# Patient Record
Sex: Female | Born: 1980 | Race: White | Hispanic: No | Smoking: Never smoker
Health system: Southern US, Community
[De-identification: ages and names within clinical notes are randomized; demographics above are authoritative.]

## PROBLEM LIST (undated history)

## (undated) DIAGNOSIS — N8111 Cystocele, midline: Secondary | ICD-10-CM

## (undated) DIAGNOSIS — K649 Unspecified hemorrhoids: Secondary | ICD-10-CM

## (undated) DIAGNOSIS — E039 Hypothyroidism, unspecified: Secondary | ICD-10-CM

## (undated) DIAGNOSIS — N816 Rectocele: Secondary | ICD-10-CM

## (undated) DIAGNOSIS — D649 Anemia, unspecified: Secondary | ICD-10-CM

## (undated) DIAGNOSIS — N393 Stress incontinence (female) (male): Secondary | ICD-10-CM

## (undated) DIAGNOSIS — F32A Depression, unspecified: Secondary | ICD-10-CM

## (undated) DIAGNOSIS — F419 Anxiety disorder, unspecified: Secondary | ICD-10-CM

## (undated) HISTORY — DX: Cystocele, midline: N81.11

## (undated) HISTORY — DX: Stress incontinence (female) (male): N39.3

## (undated) HISTORY — PX: ANTERIOR AND POSTERIOR VAGINAL REPAIR: SUR5

## (undated) HISTORY — DX: Unspecified hemorrhoids: K64.9

## (undated) HISTORY — PX: HYSTEROSCOPY WITH D & C: SHX1775

## (undated) HISTORY — PX: BILATERAL SALPINGECTOMY: SHX5743

## (undated) HISTORY — DX: Hypothyroidism, unspecified: E03.9

## (undated) HISTORY — DX: Rectocele: N81.6

---
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
## 2009-07-19 ENCOUNTER — Inpatient Hospital Stay: Payer: Self-pay

## 2012-01-20 ENCOUNTER — Observation Stay: Payer: Self-pay | Admitting: Obstetrics and Gynecology

## 2012-05-25 ENCOUNTER — Inpatient Hospital Stay: Payer: Self-pay | Admitting: Obstetrics & Gynecology

## 2012-05-25 LAB — CBC WITH DIFFERENTIAL/PLATELET
Basophil %: 0.4 %
Eosinophil %: 0.4 %
HCT: 35.5 % (ref 35.0–47.0)
Lymphocyte #: 2.4 10*3/uL (ref 1.0–3.6)
MCH: 31.3 pg (ref 26.0–34.0)
MCHC: 34.3 g/dL (ref 32.0–36.0)
Monocyte %: 6.1 %
Platelet: 261 10*3/uL (ref 150–440)
RBC: 3.89 10*6/uL (ref 3.80–5.20)
RDW: 13.9 % (ref 11.5–14.5)
WBC: 14.2 10*3/uL — ABNORMAL HIGH (ref 3.6–11.0)

## 2012-05-27 LAB — HEMATOCRIT: HCT: 35 % (ref 35.0–47.0)
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
## 2015-02-03 NOTE — H&P (Signed)
L&D Evaluation:  History:   HPI 34yo G2P1001 at 1451w5d by first trimester U/S present with c/o rupture of membranes  at 2100 today.  [facility].  PNL: A+ / RI / RPR NR / Hep B - / HIV - / VI / GBS - / ASCUS HPV+ pap    Presents with leaking fluid    [name] Medical History anxiety/depression    [name] Surgical History none    Medications Pre Natal Vitamins  zoloft    Allergies NKDA    Social History none    Family History Non-Contributory   ROS:   ROS All systems were reviewed.  HEENT, CNS, GI, GU, Respiratory, CV, Renal and Musculoskeletal systems were found to be normal.   Exam:   Vital Signs stable    General no apparent distress    Mental Status clear    Chest clear    Heart normal sinus rhythm    Abdomen gravid, non-tender    Estimated Fetal Weight Small for gestational age, 11%ile by U/S on 8/27    Pelvic 2 / 50 / -3    Mebranes Ruptured, gross    Description clear    FHT Description 140 moderate variability, no accels, no decels    Ucx absent   Impression:   Impression 34yo G2P1 at 4751w5d with PROM   Plan:   Plan Will await onset of labor for ~4hrs p rupture of membranes, if no signs of labor will augment with pitocin   Electronic Signatures: Garnette GunnerStansbury Clipp, Ali LoweEryn K (MD)  (Signed 30-Aug-13 22:37)  Authored: L&D Evaluation   Last Updated: 30-Aug-13 22:37 by Garnette GunnerStansbury Clipp, Ali LoweEryn K (MD)

## 2016-07-20 ENCOUNTER — Encounter: Payer: Self-pay | Admitting: *Deleted

## 2016-08-24 ENCOUNTER — Encounter: Payer: Self-pay | Admitting: Obstetrics and Gynecology

## 2016-09-05 ENCOUNTER — Ambulatory Visit (INDEPENDENT_AMBULATORY_CARE_PROVIDER_SITE_OTHER): Payer: 59 | Admitting: Obstetrics and Gynecology

## 2016-09-05 ENCOUNTER — Encounter: Payer: Self-pay | Admitting: Obstetrics and Gynecology

## 2016-09-05 DIAGNOSIS — Z01419 Encounter for gynecological examination (general) (routine) without abnormal findings: Secondary | ICD-10-CM

## 2016-09-05 DIAGNOSIS — N393 Stress incontinence (female) (male): Secondary | ICD-10-CM

## 2016-09-05 DIAGNOSIS — N920 Excessive and frequent menstruation with regular cycle: Secondary | ICD-10-CM

## 2016-09-05 DIAGNOSIS — Z0000 Encounter for general adult medical examination without abnormal findings: Secondary | ICD-10-CM

## 2016-09-05 DIAGNOSIS — N923 Ovulation bleeding: Secondary | ICD-10-CM

## 2016-09-05 HISTORY — DX: Stress incontinence (female) (male): N39.3

## 2016-09-05 LAB — POCT URINALYSIS DIPSTICK
Bilirubin, UA: NEGATIVE
GLUCOSE UA: NEGATIVE
KETONES UA: NEGATIVE
Nitrite, UA: NEGATIVE
Protein, UA: NEGATIVE
RBC UA: NEGATIVE
Urobilinogen, UA: 0.2
pH, UA: 7

## 2016-09-05 NOTE — Progress Notes (Signed)
Obstetrics and Gynecology Visit Established Patient Evaluation  Appointment Date: 09/05/2016  [facility]  Primary Care Provider: No primary care provider on file.  Chief Complaint:  Chief Complaint  Patient presents with  . Gynecologic Exam    History of Present Illness: Lindsay Underwood is a 35 y.o. Caucasian G2P2002 ([name] last menstrual period was 08/22/2016.), seen for the above chief complaint.   Patient still with intermenstrual bleeding. She does have regular, qmonth periods that last for about 3-4days and feels heavier and crampier than usual with 2-3d of intermenstrual bleeding (slightly crampy and not has heavy as a period) about half way through her period.   Patient also has occasional SUI s/s but doesn't have to wear pad or panty liner  Patient feels occasional bulge s/s and does have to splint to defecate fully. She also feels sometimes she doesn't empty her bladder fully (voids and then feels like she has to void again soon thereafter). No increased urinary frequency or malodorous urine.   No breast s/s, fevers, chills, chest pain, SOB, nausea, vomiting, abdominal pain, dysuria, hematuria, vaginal itching, dyspareunia, OAB s/s, diarrhea, constipation, blood in BMs  Review of Systems: as noted in the History of Present Illness.   Past Medical History:  Past Medical History:  Diagnosis Date  . Cystocele, midline   . Rectocele     Past Surgical History:  History reviewed. No pertinent surgical history.  Past Obstetrical History:  OB History  Gravida Para Term Preterm AB Living  2 2 2     2   SAB TAB Ectopic Multiple Live Births          2    # Outcome Date GA Lbr Len/2nd Weight Sex Delivery Anes PTL Lv  2 Term 05/26/12 6060w0d  6 lb 5 oz (2.863 kg) M Vag-Spont   LIV  1 Term 07/19/09 6625w0d  6 lb 1 oz (2.75 kg) M Vag-Spont   LIV      Past Gynecological History: As per HPI. No. history of abnormal pap smears (last pap smear: 2016,  which was negative and hpv negative) No. history of STIs.   She is currently using condoms for contraception.   Social History:  Social History   Social History  . Marital status: Married    Spouse name: N/A  . Number of children: N/A  . Years of education: N/A   Occupational History  . Not on file.   Social History Main Topics  . Smoking status: Never Smoker  . Smokeless tobacco: Never Used  . Alcohol use Yes     Comment: occasional  . Drug use: No  . Sexual activity: Yes    Birth control/ protection: None   Other Topics Concern  . Not on file   Social History Narrative  . No narrative on file    Family History:  Family History  Problem Relation Age of Onset  . Diabetes Father   . Heart disease Father   . Huntington's disease Mother   . Huntington's disease Brother    She denies any female cancers, bleeding or blood clotting disorders.  Patient is negative for Huntington's  Medications Ms. Birdena JubileeMuniz had no medications administered during this visit. Current Outpatient Prescriptions  Medication Sig Dispense Refill  . acetaminophen (TYLENOL) 500 MG chewable tablet Chew 500 mg by mouth every 6 (six) hours as needed for pain.    . cetirizine (ZYRTEC) 10 MG tablet Take 10 mg by mouth daily.  No current facility-administered medications for this visit.     Allergies Patient has no known allergies.   Physical Exam:  BP 105/70 (BP Location: Left Arm, Patient Position: Sitting, Cuff Size: Normal)   Pulse 72   Resp 18   Ht 5\' 4"  (1.626 m)   Wt 131 lb (59.4 kg)   LMP 08/22/2016   BMI 22.49 kg/m  Body mass index is 22.49 kg/m. General appearance: Well nourished, well developed female in no acute distress.  Neck:  Supple, normal appearance, and no thyromegaly  Cardiovascular: normal s1 and s2.  No murmurs, rubs or gallops. Respiratory:  Clear to auscultation bilateral. Normal respiratory effort Abdomen: positive bowel sounds and no masses, hernias; diffusely  non tender to palpation, non distended Breasts: breasts appear normal, no suspicious masses, no skin or nipple changes or axillary nodes, and negative inspection. Neuro/Psych:  Normal mood and affect.  Skin:  Warm and dry.  Lymphatic:  No inguinal lymphadenopathy.   Pelvic exam: is not limited by body habitus EGBUS: within normal limits, Vagina: within normal limits and with no blood or discharge in the vault, Cervix: normal appearing cervix without tenderness, discharge or lesions. Uterus:  Nonenlarged, nttp, mobile and Adnexa:  normal adnexa Rectovaginal: deferred. Several small approx 1cm sized hemorrhoids  POP-Q Aa 0 Ba -1 C 6 D 6 GH 3 PB 3 TVL 8 Ap 0 Bp -1  Mild strength with contraction  Laboratory: as above. U/a with moderate leukocytes  Radiology: 11/2014 u/s at [facility] 9 x 5 x 5 cm uterus, anteverted, normal ES and ovaries.  Assessment: pt stable  Plan:  *Anterior and posterior vault prolapse: I told her that based on degree of prolapse with possible apical prolapse that if patient is amenable to surgical intervention that I'd have her evaluated and operated on by Urogyn. I told her that based on her s/s that exp management is certaintly an option but I told her that if she desired intervention that Urogyn would like recommend surgery and possible hysterectomy given some apical prolapse. She is sure that she's done with childbearing. I told her that if she thinks she'd want a uterine sparing procedure that they can also evaluate her with h/s and do BTL then too. Pt to consider options and let us know if she'd like a referral to them to go over her options.  *AUB: [facility] surg path results for embx done last year with me but patient states it was negative and only showed endometrial polyp. I told her if that's the case then would recommend h/s, d&c for polyp and pt to consider this. See above. *Well woman exam: pt considering BC options. If for  surgeries, she'd like to have BTL. R/b/a of l/s BTL d/w pt.   Orders Placed This Encounter  Procedures  . Urine Culture  . POCT Urinalysis Dipstick    RTC PRN  Cornelia Copaharlie Anastassia Noack, Jr MD Attending [facility](Faculty Practice)

## 2016-09-05 NOTE — Patient Instructions (Signed)
Laparoscopic Tubal Ligation Introduction Laparoscopic tubal ligation is a procedure to close the fallopian tubes. This is done so that you cannot get pregnant. When the fallopian tubes are closed, the eggs that your ovaries release cannot enter the uterus, and sperm cannot reach the released eggs. A laparoscopic tubal ligation is sometimes called "getting your tubes tied." You should not have this procedure if you want to get pregnant someday or if you are unsure about having more children. Tell a health care provider about:  Any allergies you have.  All medicines you are taking, including vitamins, herbs, eye drops, creams, and over-the-counter medicines.  Any problems you or family members have had with anesthetic medicines.  Any blood disorders you have.  Any surgeries you have had.  Any medical conditions you have.  Whether you are pregnant or may be pregnant.  Any past pregnancies. What are the risks? Generally, this is a safe procedure. However, problems may occur, including:  Infection.  Bleeding.  Injury to surrounding organs.  Side effects from anesthetics.  Failure of the procedure. This procedure can increase your risk of a kind of pregnancy in which a fertilized egg attaches to the outside of the uterus (ectopic pregnancy). What happens before the procedure?  Ask your health care provider about:  Changing or stopping your regular medicines. This is especially important if you are taking diabetes medicines or blood thinners.  Taking medicines such as aspirin and ibuprofen. These medicines can thin your blood. Do not take these medicines before your procedure if your health care provider instructs you not to.  Follow instructions from your health care provider about eating and drinking restrictions.  Plan to have someone take you home after the procedure.  If you go home right after the procedure, plan to have someone with you for 24 hours. What happens during  the procedure?  You will be given one or more of the following:  A medicine to help you relax (sedative).  A medicine to numb the area (local anesthetic).  A medicine to make you fall asleep (general anesthetic).  A medicine that is injected into an area of your body to numb everything below the injection site (regional anesthetic).  An IV tube will be inserted into one of your veins. It will be used to give you medicines and fluids during the procedure.  Your bladder may be emptied with a small tube (catheter).  If you have been given a general anesthetic, a tube will be put down your throat to help you breathe.  Two small cuts (incisions) will be made in your lower abdomen and near your belly button.  Your abdomen will be inflated with a gas. This will let the surgeon see better and will give the surgeon room to work.  A thin, lighted tube (laparoscope) with a camera attached will be inserted into your abdomen through one of the incisions. Small instruments will be inserted through the other incision.  The fallopian tubes will be tied off, burned (cauterized), or blocked with a clip, ring, or clamp. A small portion in the center of each fallopian tube may be removed.  The gas will be released from the abdomen.  The incisions will be closed with stitches (sutures).  A bandage (dressing) will be placed over the incisions. The procedure may vary among health care providers and hospitals. What happens after the procedure?  Your blood pressure, heart rate, breathing rate, and blood oxygen level will be monitored often until the medicines you  were given have worn off.  You will be given medicine to help with pain, nausea, and vomiting as needed. This information is not intended to replace advice given to you by your health care provider. Make sure you discuss any questions you have with your health care provider. Document Released: 12/19/2000 Document Revised: 02/18/2016 Document  Reviewed: 08/23/2015  2017 Elsevier Anterior and Posterior Colporrhaphy Anterior or posterior colporrhaphy is surgery to fix a prolapse of organs in the genital tract. Prolapse means the falling down, bulging, dropping, or drooping of an organ. Organs that commonly prolapse include the rectum, bladder, vagina, and uterus. Prolapse can affect a single organ or several organs at the same time. This often worsens when women stop having their monthly periods (menopause) because estrogen loss weakens the muscles and tissues in the genital tract. In addition, prolapse happens when the organs are damaged or weakened. This commonly happens after childbirth and as a result of aging. Surgery is often done for severe prolapses.  The type of colporrhaphy done depends on the type of genital prolapse. Types of genital prolapse include the following:   Cystocele. This is a prolapse of the upper (anterior) wall of the vagina. The anterior wall bulges into the vagina and brings the bladder with it.   Rectocele. This is a prolapse of the lower (posterior) wall of the vagina. The posterior vaginal wall bulges into the vagina and brings the rectum with it.   Enterocele. This is a prolapse of part of the pelvic organs called the pouch of Douglas. It also involves a portion of the small bowel. It appears as a bulge under the neck of the uterus at the top of the back wall of the vagina.   Procidentia. This is a complete prolapse of the uterus and the cervix. The prolapse can be seen and felt coming out of the vagina. LET [facility] KNOW ABOUT:   Any allergies you have.   All medicines you are taking, including vitamins, herbs, eye drops, creams, and over-the-counter medicines.   Previous problems you or members of your family have had with the use of anesthetics.   Any blood disorders you have.   Previous surgeries you have had.   Medical conditions you have.   Smoking history or history  of alcohol use.   Possibility of pregnancy, if this applies.  RISKS AND COMPLICATIONS Generally, anterior or posterior colporrhaphy is a safe procedure. However, as with any procedure, complications can occur. Possible complications include:   Infection.   Damage to other organs during surgery.   Bleeding after surgery.   Problems urinating.   Problems from the anesthetic.  BEFORE THE PROCEDURE  Ask your health care provider about changing or stopping your regular medicines.   Do not eat or drink anything for at least 8 hours before the surgery.   If you smoke, do not smoke for at least 2 weeks before the surgery.   Make plans to have someone drive you home after your hospital stay. Also, arrange for someone to help you with activities during recovery. PROCEDURE  You may be given medicine to help you relax before the surgery (sedative). During the surgery you will be given medicine to make you sleep through the procedure (general anesthetic) or medicine to numb you from the waist down (spinal anesthetic). This medicine will be given through an intravenous (IV) access tube that is put into one of your veins.  The procedure will vary depending on the type  of repair:   Anterior repair. A cut (incision) is made in the midline section of the front part of the vaginal wall. A triangular-shaped piece of vaginal tissue is removed, and the stronger, healthier tissue is sewn together in order to support and suspend the bladder.   Posterior repair. An incision is made midline on the back wall of the vagina. A triangular portion of vaginal skin is removed to expose the muscle. Excess tissue is removed, and stronger, healthier muscle and ligament tissue is sewn together to support the rectum.   Anterior and posterior repair. Both procedures are done during the same surgery. AFTER THE PROCEDURE You will be taken to a recovery area. Your blood pressure, pulse, breathing, and  temperature (vital signs) will be monitored. This is done until you are stable. Then you will be transferred to a hospital room.  After surgery, you will have a small rubber tube in place to drain your bladder (urinary catheter). This will be in place for 2 to 7 days or until your bladder is working properly on its own. The IV access tube will be removed in 1 to 3 days. You may have a gauze packing in your vagina to prevent bleeding. This will be removed 2 or 3 days after the surgery. You will likely need to stay in the hospital for 3 to 5 days.  This information is not intended to replace advice given to you by your health care provider. Make sure you discuss any questions you have with your health care provider. Document Released: 12/03/2003 Document Revised: 05/15/2013 Document Reviewed: 02/01/2013 Elsevier Interactive Patient Education  2017 Elsevier Inc. Hysteroscopy Hysteroscopy is a procedure used for looking inside the womb (uterus). It may be done for various reasons, including:  To evaluate abnormal bleeding, fibroid (benign, noncancerous) tumors, polyps, scar tissue (adhesions), and possibly cancer of the uterus.  To look for lumps (tumors) and other uterine growths.  To look for causes of why a woman cannot get pregnant (infertility), causes of recurrent loss of pregnancy (miscarriages), or a lost intrauterine device (IUD).  To perform a sterilization by blocking the fallopian tubes from inside the uterus. In this procedure, a thin, flexible tube with a tiny light and camera on the end of it (hysteroscope) is used to look inside the uterus. A hysteroscopy should be done right after a menstrual period to be sure you are not pregnant. LET [facility] KNOW ABOUT:   Any allergies you have.  All medicines you are taking, including vitamins, herbs, eye drops, creams, and over-the-counter medicines.  Previous problems you or members of your family have had with the use of  anesthetics.  Any blood disorders you have.  Previous surgeries you have had.  Medical conditions you have. RISKS AND COMPLICATIONS  Generally, this is a safe procedure. However, as with any procedure, complications can occur. Possible complications include:  Putting a hole in the uterus.  Excessive bleeding.  Infection.  Damage to the cervix.  Injury to other organs.  Allergic reaction to medicines.  Too much fluid used in the uterus for the procedure. BEFORE THE PROCEDURE   Ask your health care provider about changing or stopping any regular medicines.  Do not take aspirin or blood thinners for 1 week before the procedure, or as directed by your health care provider. These can cause bleeding.  If you smoke, do not smoke for 2 weeks before the procedure.  In some cases, a medicine is placed in the  cervix the day before the procedure. This medicine makes the cervix have a larger opening (dilate). This makes it easier for the instrument to be inserted into the uterus during the procedure.  Do not eat or drink anything for at least 8 hours before the surgery.  Arrange for someone to take you home after the procedure. PROCEDURE   You may be given a medicine to relax you (sedative). You may also be given one of the following:  A medicine that numbs the area around the cervix (local anesthetic).  A medicine that makes you sleep through the procedure (general anesthetic).  The hysteroscope is inserted through the vagina into the uterus. The camera on the hysteroscope sends a picture to a TV screen. This gives the surgeon a good view inside the uterus.  During the procedure, air or a liquid is put into the uterus, which allows the surgeon to see better.  Sometimes, tissue is gently scraped from inside the uterus. These tissue samples are sent to a lab for testing. AFTER THE PROCEDURE   If you had a general anesthetic, you may be groggy for a couple hours after the  procedure.  If you had a local anesthetic, you will be able to go home as soon as you are stable and feel ready.  You may have some cramping. This normally lasts for a couple days.  You may have bleeding, which varies from light spotting for a few days to menstrual-like bleeding for 3-7 days. This is normal.  If your test results are not back during the visit, make an appointment with your health care provider to find out the results. This information is not intended to replace advice given to you by your health care provider. Make sure you discuss any questions you have with your health care provider. Document Released: 12/19/2000 Document Revised: 07/03/2013 Document Reviewed: 04/11/2013 Elsevier Interactive Patient Education  2017 [employer].

## 2016-09-06 ENCOUNTER — Encounter: Payer: Self-pay | Admitting: *Deleted

## 2016-09-06 LAB — URINE CULTURE: ORGANISM ID, BACTERIA: NO GROWTH

## 2016-09-26 DIAGNOSIS — D649 Anemia, unspecified: Secondary | ICD-10-CM

## 2016-09-26 HISTORY — DX: Anemia, unspecified: D64.9

## 2016-10-05 ENCOUNTER — Other Ambulatory Visit: Payer: Self-pay | Admitting: Obstetrics and Gynecology

## 2016-10-05 DIAGNOSIS — N812 Incomplete uterovaginal prolapse: Secondary | ICD-10-CM

## 2016-10-19 ENCOUNTER — Ambulatory Visit
Admission: EM | Admit: 2016-10-19 | Discharge: 2016-10-19 | Disposition: A | Discharge status: Home / Self Care | Payer: 59 | Attending: Family Medicine | Admitting: Family Medicine

## 2016-10-19 DIAGNOSIS — J0100 Acute maxillary sinusitis, unspecified: Secondary | ICD-10-CM | POA: Diagnosis not present

## 2016-10-19 LAB — RAPID INFLUENZA A&B ANTIGENS
Influenza A (ARMC): NEGATIVE
Influenza B (ARMC): NEGATIVE

## 2016-10-19 MED ORDER — AMOXICILLIN-POT CLAVULANATE 875-125 MG PO TABS: 1.0000 | ORAL_TABLET | Freq: Two times a day (BID) | ORAL | 0 refills | Status: DC

## 2016-10-19 NOTE — ED Triage Notes (Signed)
Patient complains of sinus pain and pressure, drainage, headaches and sneezing. Patient states that symptoms have been constant x 1 month with worsening yesterday. Patient states that she has been taking OTC allergy medication without relief.

## 2016-10-19 NOTE — Discharge Instructions (Signed)
Take medication as prescribed. Rest. Drink plenty of fluids.  ° °Follow up with your primary care physician this week as needed. Return to Urgent care for new or worsening concerns.  ° °

## 2016-10-19 NOTE — ED Provider Notes (Signed)
[facility] ____________________________________________  Time seen: Approximately 1620 PM  I have reviewed the triage vital signs and the nursing notes.   HISTORY  Chief Complaint Sinusitis  HPI Lindsay Underwood is a 36 y.o. female presenting for the complaints of runny nose, nasal congestion, sinus pressure and sinus discomfort the last 3-4 weeks. Patient reports history of seasonal allergies as well as intermittent sinusitis. Patient reports initially she felt like she had a cold with sinus irritation initially, however reports symptoms started to improve but then worsened. Patient reports the last week she has had continuous sinus pressure discomfort around her cheek bones with production of very thick  yellowish mucus with blowing her nose. Patient reports today she felt like she had some chills and had a low-grade fever. Reports has been taking over-the-counter Tylenol or ibuprofen as well as Sudafed with some improvement, but no resolution. Reports continues to eat and drink well. Denies recent sickness. States occasional cough. Patient reports has continued to remain active, and otherwise feels well.  Denies chest pain, shortness of breath, abdominal pain, dysuria, extremity pain, extremity swelling or rash. Denies recent sickness. Denies recent antibiotic use.   [name] last menstrual period was 09/26/2016. Denies pregnancy   Past Medical History:  Diagnosis Date  . Cystocele, midline   . Hemorrhoids   . Rectocele     Patient Active Problem List   Diagnosis Date Noted  . Urinary, incontinence, stress female 09/05/2016  . Intermenstrual bleeding 09/05/2016    Past Surgical History:  Procedure Laterality Date  . NO PAST SURGERIES       No current facility-administered medications for this encounter.   Current Outpatient Prescriptions:  .  acetaminophen (TYLENOL) 500 MG chewable tablet, Chew 500 mg by mouth every 6 (six) hours as needed for pain., Disp: ,  Rfl:  .  cetirizine (ZYRTEC) 10 MG tablet, Take 10 mg by mouth daily., Disp: , Rfl:  .  amoxicillin-clavulanate (AUGMENTIN) 875-125 MG tablet, Take 1 tablet by mouth every 12 (twelve) hours., Disp: 20 tablet, Rfl: 0  Allergies Patient has no known allergies.  Family History  Problem Relation Age of Onset  . Diabetes Father   . Heart disease Father   . Huntington's disease Mother   . Huntington's disease Brother     Social History Social History  Substance Use Topics  . Smoking status: Never Smoker  . Smokeless tobacco: Never Used  . Alcohol use Yes     Comment: occasional    Review of Systems Constitutional: As above.  Eyes: No visual changes. ENT: No sore throat. Cardiovascular: Denies chest pain. Respiratory: Denies shortness of breath. Gastrointestinal: No abdominal pain.  No nausea, no vomiting.  No diarrhea.  No constipation. Genitourinary: Negative for dysuria. Musculoskeletal: Negative for back pain. Skin: Negative for rash. Neurological: Negative for headaches, focal weakness or numbness.  10-point ROS otherwise negative.  ____________________________________________   PHYSICAL EXAM:  VITAL SIGNS: ED Triage Vitals  Enc Vitals Group     BP 10/19/16 1550 (!) 105/58     Pulse Rate 10/19/16 1550 78     Resp 10/19/16 1550 17     Temp 10/19/16 1550 98.9 F (37.2 C)     Temp Source 10/19/16 1550 Oral     SpO2 10/19/16 1550 100 %     Weight 10/19/16 1548 130 lb (59 kg)     Height 10/19/16 1548 5\' 4"  (1.626 m)     Head Circumference --      Peak Flow --  Pain Score 10/19/16 1549 6     Pain Loc --      Pain Edu? --      Excl. in GC? --     Constitutional: Alert and oriented. Well appearing and in no acute distress. Eyes: Conjunctivae are normal. PERRL. EOMI. Head: Atraumatic.Mild to moderate tenderness to palpation bilateral maxillary sinuses; no frontal sinus tenderness to palpation. No swelling. No erythema.   Ears: no erythema, normal TMs  bilaterally.   Nose: nasal congestion with bilateral nasal turbinate erythema and edema.   Mouth/Throat: Mucous membranes are moist.  Oropharynx non-erythematous.No tonsillar swelling or exudate.  Neck: No stridor.  No cervical spine tenderness to palpation. Hematological/Lymphatic/Immunilogical: No cervical lymphadenopathy. Cardiovascular: Normal rate, regular rhythm. Grossly normal heart sounds.  Good peripheral circulation. Respiratory: Normal respiratory effort.  No retractions. No wheezes, rales or rhonchi. Good air movement.  Gastrointestinal: Soft and nontender. No distention.  No CVA tenderness. Musculoskeletal: Ambulatory with steady gait. No cervical, thoracic or lumbar tenderness to palpation.  Neurologic:  Normal speech and language. No gait instability. Skin:  Skin is warm, dry and intact. No rash noted. Psychiatric: Mood and affect are normal. Speech and behavior are normal.  ___________________________________________   LABS (all labs ordered are listed, but only abnormal results are displayed)  Labs Reviewed  RAPID INFLUENZA A&B ANTIGENS ([facility] ONLY)   PROCEDURES Procedures    INITIAL IMPRESSION / ASSESSMENT AND PLAN / ED COURSE  Pertinent labs & imaging results that were available during my care of the patient were reviewed by me and considered in my medical decision making (see chart for details).  Well-appearing patient. No acute distress. Suspect sinusitis. Patient however stresses concern of influenza as well. Influenza test negative. Will treat patient for sinusitis with oral Augmentin. Encouraged supportive care, rest, fluids, Tylenol or ibuprofen as needed. Discussed follow-up and return parameters.Discussed indication, risks and benefits of medications with patient.  Discussed follow up with Primary care physician this week. Discussed follow up and return parameters including no resolution or any worsening concerns. Patient verbalized understanding and agreed  to plan.   ____________________________________________   FINAL CLINICAL IMPRESSION(S) / ED DIAGNOSES  Final diagnoses:  Acute maxillary sinusitis, recurrence not specified     Discharge Medication List as of 10/19/2016  5:05 PM    START taking these medications   Details  amoxicillin-clavulanate (AUGMENTIN) 875-125 MG tablet Take 1 tablet by mouth every 12 (twelve) hours., Starting Wed 10/19/2016, Normal        Note: This dictation was prepared with Dragon dictation along with smaller phrase technology. Any transcriptional errors that result from this process are unintentional.         Renford DillsLindsey Prisila Dlouhy, NP 10/19/16 2101

## 2018-03-14 ENCOUNTER — Encounter: Payer: Self-pay | Admitting: Obstetrics and Gynecology

## 2018-03-14 ENCOUNTER — Ambulatory Visit (INDEPENDENT_AMBULATORY_CARE_PROVIDER_SITE_OTHER): Payer: BLUE CROSS/BLUE SHIELD | Admitting: Obstetrics and Gynecology

## 2018-03-14 VITALS — BP 109/70 | HR 69

## 2018-03-14 DIAGNOSIS — Z01419 Encounter for gynecological examination (general) (routine) without abnormal findings: Secondary | ICD-10-CM

## 2018-03-14 DIAGNOSIS — N923 Ovulation bleeding: Secondary | ICD-10-CM

## 2018-03-14 DIAGNOSIS — Z124 Encounter for screening for malignant neoplasm of cervix: Secondary | ICD-10-CM

## 2018-03-14 DIAGNOSIS — Z113 Encounter for screening for infections with a predominantly sexual mode of transmission: Secondary | ICD-10-CM

## 2018-03-14 DIAGNOSIS — Z1151 Encounter for screening for human papillomavirus (HPV): Secondary | ICD-10-CM | POA: Diagnosis not present

## 2018-03-14 NOTE — Progress Notes (Signed)
Discuss cycle  

## 2018-03-14 NOTE — Progress Notes (Signed)
Obstetrics and Gynecology Annual Patient Evaluation  Appointment Date: 03/14/2018  OBGYN Clinic: [facility]  Primary Care Provider: Nira Retort Chief Complaint:  Chief Complaint  Patient presents with  . Gynecologic Exam    History of Present Illness: Samatha Anspach is a 37 y.o. Caucasian G2P2002 ([name] last menstrual period was 02/17/2018.), seen for the above chief complaint. Her past medical history is significant for SVD x 2, 07/2017 l/s BS/hysteroscopy/D&C/A-P repair at [facility]; pathology negative  Patient states that AUB was fixed for a month or two but has been persistent since then. She states she has a period for about 5 days and heavy and painful especially the first few days but then has intermittent bleeding and spotting for sometimes a week or two after that. It can feel somewhat crampy and be BRB or old brown in color.   Has rare dyspareunia with insertion.    No breast s/s, fevers, nausea, vomiting, abdominal pain, dysuria, hematuria, vaginal itching, SUI or OAB  Review of Systems:as noted in the History of Present Illness.   Past Medical History:  Past Medical History:  Diagnosis Date  . Cystocele, midline   . Hemorrhoids   . Rectocele   . Urinary, incontinence, stress female 09/05/2016    Past Surgical History:  Past Surgical History:  Procedure Laterality Date  . ANTERIOR AND POSTERIOR VAGINAL REPAIR     07/2017. done with h/s, D&C, BTL  . BILATERAL SALPINGECTOMY     laparoscopic  . HYSTEROSCOPY W/D&C      Past Obstetrical History:  OB History  Gravida Para Term Preterm AB Living  2 2 2     2   SAB TAB Ectopic Multiple Live Births          2    # Outcome Date GA Lbr Len/2nd Weight Sex Delivery Anes PTL Lv  2 Term 05/26/12 [redacted]w[redacted]d  6 lb 5 oz (2.863 kg) M Vag-Spont   LIV  1 Term 07/19/09 [redacted]w[redacted]d  6 lb 1 oz (2.75 kg) M Vag-Spont   LIV    Past Gynecological History: As per HPI. History of Pap Smear(s): Yes.   Last pap  2016, which was nilm/hpv neg She is currently using b/l salpingectomy for contraception.   Social History:  Social History   Socioeconomic History  . Marital status: Married    Spouse name: Not on file  . Number of children: Not on file  . Years of education: Not on file  . Highest education level: Not on file  Occupational History  . Not on file  Social Needs  . Financial resource strain: Not on file  . Food insecurity:    Worry: Not on file    Inability: Not on file  . Transportation needs:    Medical: Not on file    Non-medical: Not on file  Tobacco Use  . Smoking status: Never Smoker  . Smokeless tobacco: Never Used  Substance and Sexual Activity  . Alcohol use: Yes    Comment: occasional  . Drug use: No  . Sexual activity: Yes    Birth control/protection: None  Lifestyle  . Physical activity:    Days per week: Not on file    Minutes per session: Not on file  . Stress: Not on file  Relationships  . Social connections:    Talks on phone: Not on file    Gets together: Not on file    Attends religious service: Not on file    Active member of  club or organization: Not on file    Attends meetings of clubs or organizations: Not on file    Relationship status: Not on file  . Intimate partner violence:    Fear of current or ex partner: Not on file    Emotionally abused: Not on file    Physically abused: Not on file    Forced sexual activity: Not on file  Other Topics Concern  . Not on file  Social History Narrative  . Not on file    Family History:  Family History  Problem Relation Age of Onset  . Diabetes Father   . Heart disease Father   . Huntington's disease Mother   . Huntington's disease Brother    She denies any female cancers  Medications Agustin CreeHolly Mena had no medications administered during this visit. Current Outpatient Medications  Medication Sig Dispense Refill  . acetaminophen (TYLENOL) 500 MG chewable tablet Chew 500 mg by mouth every 6  (six) hours as needed for pain.    . cetirizine (ZYRTEC) 10 MG tablet Take 10 mg by mouth daily.    . Multiple Vitamin (MULTI-VITAMINS) TABS Take by mouth.     No current facility-administered medications for this visit.     Allergies Patient has no known allergies.   Physical Exam:  BP 109/70   Pulse 69   LMP 02/17/2018  There is no height or weight on file to calculate BMI.  General appearance: Well nourished, well developed female in no acute distress.  Neck:  Supple, normal appearance, and no thyromegaly  Cardiovascular: normal s1 and s2.  No murmurs, rubs or gallops. Respiratory:  Clear to auscultation bilateral. Normal respiratory effort Abdomen: positive bowel sounds and no masses, hernias; diffusely non tender to palpation, non distended Breasts: breasts appear normal, no suspicious masses, no skin or nipple changes or axillary nodes, and normal palpation. Neuro/Psych:  Normal mood and affect.  Skin:  Warm and dry.  Lymphatic:  No inguinal lymphadenopathy.   Pelvic exam: is not limited by body habitus EGBUS: within normal limits, Vagina: within normal limits and with no blood or discharge in the vault, Cervix: normal appearing cervix without tenderness, discharge or lesions. Uterus:  nonenlarged and non tender and Adnexa:  normal adnexa and no mass, fullness, tenderness  No pain during exam Anterior and posterior vault comes to about -1 to -2 on valsalva with slight cervix movement with valsalva. No e/o prolapse w/o valsalva Rectovaginal: deferred. 1.5-2cm deflated hemorrhoids seen  Laboratory: no  Radiology:  EXAM: US PELVIC PROTOCOL TRANSABDOMINAL AND TRANSVAGINAL COMPLETE  INDICATION: 37 years old Female with abnormal vaginal bleeding.   COMPARISON:None  TECHNIQUE:The examination was performed in two phases.First, transabdominal technique was performed utilizing the bladder as a sonic window.Second, in order to optimally evaluate the adnexal regions  and endometrium, an endovaginal study was performed.  FINDINGS: Uterus:The uterus measures 7.3 x 4.4 x 5.6 cm. No focal myometrial masses are seen.Endometrial stripe thickness measures 5 mm. No endometrial masses identified.  Right ovary: The right ovary measures 2.6 x 1.9 x 1.8 cm. No suspicious right ovarian masses are seen. There are small follicles.There is normal ovarian blood flow.  Left ovary:The left ovary measures 3.4 x 1.9 x 1.7 cm. No suspicious left ovarian masses are seen. There are small follicles.There is normal ovarian blood flow.  Negative for other mass or free pelvic fluid.  IMPRESSION:  Unremarkable pelvic ultrasound.  Electronically Signed DG:LOVFby:Mark Dorothyann GibbsNeely, MD, [facility] Electronically Signed on:08/15/2017 8:01 PM  Other Result  Information  Interface, Rad Results In - 08/15/2017  8:02 PM EST EXAM: US PELVIC PROTOCOL TRANSABDOMINAL AND TRANSVAGINAL COMPLETE  INDICATION: 37 years old Female with abnormal vaginal bleeding.   COMPARISON:  None  TECHNIQUE:  The examination was performed in two phases.  First, transabdominal technique was performed utilizing the bladder as a sonic window.  Second, in order to optimally evaluate the adnexal regions and endometrium, an endovaginal study was performed.  FINDINGS: Uterus:  The uterus measures 7.3 x 4.4 x 5.6 cm. No focal myometrial masses are seen.  Endometrial stripe thickness measures 5 mm. No endometrial masses identified.  Right ovary: The right ovary measures 2.6 x 1.9 x 1.8 cm. No suspicious right ovarian masses are seen. There are small follicles.  There is normal ovarian blood flow.  Left ovary:  The left ovary measures 3.4 x 1.9 x 1.7 cm. No suspicious left ovarian masses are seen. There are small follicles.  There is normal ovarian blood flow.  Negative for other mass or free pelvic fluid.  IMPRESSION:  Unremarkable pelvic ultrasound.  Electronically Signed by:  Geralyn Flash,  MD, [facility] Electronically Signed on:  08/15/2017 8:01 PM   Assessment: pt stable  Plan:  1. Well woman exam with routine gynecological exam Routine care.  - Cytology - PAP   2. Intermenstrual bleeding Patient states hysterectomy not done b/c they felt she was too young to do at the time of her urogyn surgery. I d/w her re: options including ablation and medications and hysterectomy. I told her that I'd lean towards a hyst since she states she is definitely done having kids, has had a BS and this aub has been persistent for so long. I also told her that I'm confident that could be done vaginally or laparoscopically (vagina preferred by me) and shouldn't affect her prior urogyn surgeries.   She'd like to avoid medications that may make any s/s of depression appear. I told her non hyst options include ablation and medications, with Mirena being preferred and ablation also being a good option but given her age, high chance that the effect may wear off with time and she'd need a hyst. Pt to consider options.  - TSH - CBC - Prolactin -Beta HCG  RTC PRN. Will call pt once results are back to go over next steps  Cornelia Copa MD Attending Center for [facility])

## 2018-03-15 LAB — CBC
HEMATOCRIT: 39.8 % (ref 34.0–46.6)
Hemoglobin: 13.2 g/dL (ref 11.1–15.9)
MCH: 30.6 pg (ref 26.6–33.0)
MCHC: 33.2 g/dL (ref 31.5–35.7)
MCV: 92 fL (ref 79–97)
Platelets: 356 10*3/uL (ref 150–450)
RBC: 4.32 x10E6/uL (ref 3.77–5.28)
RDW: 13.3 % (ref 12.3–15.4)
WBC: 7.2 10*3/uL (ref 3.4–10.8)

## 2018-03-15 LAB — TSH: TSH: 8.95 u[IU]/mL — AB (ref 0.450–4.500)

## 2018-03-15 LAB — BETA HCG QUANT (REF LAB): hCG Quant: <1 m[IU]/mL

## 2018-03-15 LAB — PROLACTIN: PROLACTIN: 8.8 ng/mL (ref 4.8–23.3)

## 2018-03-16 LAB — CYTOLOGY - PAP
Chlamydia: NEGATIVE
Diagnosis: NEGATIVE
HPV: NOT DETECTED
Neisseria Gonorrhea: NEGATIVE
Trichomonas: NEGATIVE

## 2018-03-19 LAB — T4, FREE: Free T4: 0.86 ng/dL (ref 0.82–1.77)

## 2018-03-19 LAB — SPECIMEN STATUS REPORT

## 2018-03-27 ENCOUNTER — Telehealth: Payer: Self-pay

## 2018-03-27 NOTE — Telephone Encounter (Signed)
-----   Message from Lindell SparHeather L Bacon, [place] sent at 03/27/2018 11:51 AM EDT ----- Regarding: has been waiting for Lab results Contact: [phone] Please call patient she is waiting to get results from [facility] from [facility] since 03/14/18. She is anxious about getting these results.

## 2018-03-27 NOTE — Telephone Encounter (Signed)
Call patient in regards to lab results. No answer or voice mail to leave a message.

## 2018-06-11 ENCOUNTER — Telehealth: Payer: Self-pay | Admitting: Obstetrics and Gynecology

## 2018-06-11 ENCOUNTER — Encounter: Payer: Self-pay | Admitting: Obstetrics and Gynecology

## 2018-06-11 DIAGNOSIS — E039 Hypothyroidism, unspecified: Secondary | ICD-10-CM | POA: Insufficient documentation

## 2018-06-11 NOTE — Telephone Encounter (Signed)
GYN Telephone Note Patient called at [phone] and VM left, also letting her know I sent her a message on my chart

## 2018-11-13 ENCOUNTER — Emergency Department: Payer: Managed Care, Other (non HMO)

## 2018-11-13 ENCOUNTER — Encounter: Payer: Self-pay | Admitting: Emergency Medicine

## 2018-11-13 ENCOUNTER — Other Ambulatory Visit: Payer: Self-pay

## 2018-11-13 ENCOUNTER — Emergency Department
Admission: EM | Admit: 2018-11-13 | Discharge: 2018-11-13 | Disposition: A | Discharge status: Home / Self Care | Payer: Managed Care, Other (non HMO) | Attending: Emergency Medicine | Admitting: Emergency Medicine

## 2018-11-13 DIAGNOSIS — S8992XA Unspecified injury of left lower leg, initial encounter: Secondary | ICD-10-CM | POA: Insufficient documentation

## 2018-11-13 DIAGNOSIS — M25562 Pain in left knee: Secondary | ICD-10-CM

## 2018-11-13 DIAGNOSIS — Y92410 Unspecified street and highway as the place of occurrence of the external cause: Secondary | ICD-10-CM | POA: Insufficient documentation

## 2018-11-13 DIAGNOSIS — Y93I9 Activity, other involving external motion: Secondary | ICD-10-CM | POA: Diagnosis not present

## 2018-11-13 DIAGNOSIS — S0990XA Unspecified injury of head, initial encounter: Secondary | ICD-10-CM | POA: Diagnosis not present

## 2018-11-13 DIAGNOSIS — Y998 Other external cause status: Secondary | ICD-10-CM | POA: Diagnosis not present

## 2018-11-13 MED ORDER — CYCLOBENZAPRINE HCL 5 MG PO TABS: ORAL_TABLET | ORAL | 0 refills | Status: DC

## 2018-11-13 MED ORDER — IBUPROFEN 600 MG PO TABS: 600.0000 mg | ORAL_TABLET | Freq: Four times a day (QID) | ORAL | 0 refills | Status: DC | PRN
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
## 2018-11-19 ENCOUNTER — Encounter: Payer: Self-pay | Admitting: Obstetrics and Gynecology

## 2018-11-19 ENCOUNTER — Ambulatory Visit (INDEPENDENT_AMBULATORY_CARE_PROVIDER_SITE_OTHER): Payer: Managed Care, Other (non HMO) | Admitting: Obstetrics and Gynecology

## 2018-11-19 VITALS — BP 107/71 | HR 71 | Wt 135.0 lb

## 2018-11-19 DIAGNOSIS — N923 Ovulation bleeding: Secondary | ICD-10-CM

## 2018-11-19 DIAGNOSIS — K649 Unspecified hemorrhoids: Secondary | ICD-10-CM | POA: Diagnosis not present
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
## 2019-04-16 ENCOUNTER — Other Ambulatory Visit: Payer: Self-pay

## 2019-04-16 ENCOUNTER — Ambulatory Visit (INDEPENDENT_AMBULATORY_CARE_PROVIDER_SITE_OTHER): Payer: Managed Care, Other (non HMO) | Admitting: Obstetrics and Gynecology

## 2019-04-16 ENCOUNTER — Encounter: Payer: Self-pay | Admitting: Obstetrics and Gynecology

## 2019-04-16 VITALS — BP 99/66 | HR 72 | Wt 135.4 lb

## 2019-04-16 DIAGNOSIS — E039 Hypothyroidism, unspecified: Secondary | ICD-10-CM

## 2019-04-16 DIAGNOSIS — N920 Excessive and frequent menstruation with regular cycle: Secondary | ICD-10-CM

## 2019-04-16 DIAGNOSIS — Z01419 Encounter for gynecological examination (general) (routine) without abnormal findings: Secondary | ICD-10-CM

## 2019-04-16 DIAGNOSIS — N923 Ovulation bleeding: Secondary | ICD-10-CM

## 2019-04-16 DIAGNOSIS — K649 Unspecified hemorrhoids: Secondary | ICD-10-CM

## 2019-04-16 NOTE — Progress Notes (Signed)
Obstetrics and Gynecology Annual Patient Evaluation  Appointment Date: 04/16/2019  OBGYN Clinic: [facility] for [facility]  Primary Care Provider: Ellene Route  Referring Provider: Self  Chief Complaint:  Chief Complaint  Patient presents with  . Gynecologic Exam    History of Present Illness: Lindsay Underwood is a 38 y.o. Caucasian G2P2002 ([name] last menstrual period was 03/29/2019.), seen for the above chief complaint.   Severe lower pelvic pain in may about 10-14d before cycle and got a little better after 45 minutes; still feels like "falling out" down below. Recent testing by PCP shows TSH elevated still but now with low fT4.  No breast s/s, vaginal discharge, vaginal itching, dyspareunia, SUI,  diarrhea, constipation.  Review of Systems: as noted in the History of Present Illness.  Past Medical History:  Past Medical History:  Diagnosis Date  . Cystocele, midline   . Hemorrhoids   . Hypothyroidism   . Rectocele   . Urinary, incontinence, stress female 09/05/2016    Past Surgical History:  Past Surgical History:  Procedure Laterality Date  . ANTERIOR AND POSTERIOR VAGINAL REPAIR     07/2017. done with h/s, D&C, BTL  . BILATERAL SALPINGECTOMY     laparoscopic  . HYSTEROSCOPY W/D&C      Past Obstetrical History:  OB History  Gravida Para Term Preterm AB Living  2 2 2     2   SAB TAB Ectopic Multiple Live Births          2    # Outcome Date GA Lbr Len/2nd Weight Sex Delivery Anes PTL Lv  2 Term 05/26/12 [redacted]w[redacted]d  6 lb 5 oz (2.863 kg) M Vag-Spont   LIV  1 Term 07/19/09 [redacted]w[redacted]d  6 lb 1 oz (2.75 kg) M Vag-Spont   LIV    Past Gynecological History: As per HPI. Periods: qmonth, regular, heavy; still with intermenstrual bleeding.  History of Pap Smear(s): Yes.   Last pap 2019, which was negative She is currently using bilateral tubal ligation for contraception.   Social History:  Social History   Socioeconomic History  . Marital status:  Married    Spouse name: Not on file  . Number of children: Not on file  . Years of education: Not on file  . Highest education level: Not on file  Occupational History  . Not on file  Social Needs  . Financial resource strain: Not on file  . Food insecurity    Worry: Not on file    Inability: Not on file  . Transportation needs    Medical: Not on file    Non-medical: Not on file  Tobacco Use  . Smoking status: Never Smoker  . Smokeless tobacco: Never Used  Substance and Sexual Activity  . Alcohol use: Yes    Comment: occasional  . Drug use: No  . Sexual activity: Yes    Birth control/protection: None  Lifestyle  . Physical activity    Days per week: Not on file    Minutes per session: Not on file  . Stress: Not on file  Relationships  . Social Herbalist on phone: Not on file    Gets together: Not on file    Attends religious service: Not on file    Active member of club or organization: Not on file    Attends meetings of clubs or organizations: Not on file    Relationship status: Not on file  . Intimate partner violence    Fear of  current or ex partner: Not on file    Emotionally abused: Not on file    Physically abused: Not on file    Forced sexual activity: Not on file  Other Topics Concern  . Not on file  Social History Narrative  . Not on file    Family History:  Family History  Problem Relation Age of Onset  . Diabetes Father   . Heart disease Father   . Huntington's disease Mother   . Huntington's disease Brother     Medications Agustin CreeHolly Cutbirth had no medications administered during this visit. Current Outpatient Medications  Medication Sig Dispense Refill  . cetirizine (ZYRTEC) 10 MG tablet Take 10 mg by mouth daily.    . montelukast (SINGULAIR) 10 MG tablet     . Multiple Vitamin (MULTI-VITAMINS) TABS Take by mouth.     No current facility-administered medications for this visit.     Allergies Patient has no known  allergies.   Physical Exam:  BP 99/66   Pulse 72   Wt 135 lb 6.4 oz (61.4 kg)   LMP 03/29/2019   BMI 23.24 kg/m  Body mass index is 23.24 kg/m. General appearance: Well nourished, well developed female in no acute distress.  Neck:  Supple, normal appearance, and no thyromegaly  Cardiovascular: normal s1 and s2.  No murmurs, rubs or gallops. Respiratory:  Clear to auscultation bilateral. Normal respiratory effort Abdomen: positive bowel sounds and no masses, hernias; diffusely non tender to palpation, non distended Breasts: breasts appear normal, no suspicious masses, no skin or nipple changes or axillary nodes, and normal palpation. Neuro/Psych:  Normal mood and affect.  Skin:  Warm and dry.  Lymphatic:  No inguinal lymphadenopathy.   Pelvic exam: is not limited by body habitus EGBUS: within normal limits, Vagina: within normal limits and with no blood or discharge in the vault, Cervix: normal appearing cervix without tenderness, discharge or lesions. Uterus:  nonenlarged and non tender and Adnexa:  normal adnexa and no mass, fullness, tenderness  Anterior and posterior prolapse to about -2 with valsava Rectovaginal: deferred. 2-3 hemorrhoids (nttp, defalted) approximately 1.5-2cm  Laboratory: none  Radiology: none  Assessment: pt doing well  Plan:  1. Intermenstrual bleeding D/w her that supplementation has a good chance of correcting her intermenstrual bleeding and may help with her dysmenorrhea and to strongly consider treating based on this. Will CC her PCP  2. Menorrhagia with regular cycle  3. Hypothyroidism, unspecified type  4. Hemorrhoid Patient would like referral to [facility] for evaluation for removal for cosmesis and sometimes some discomfort  RTC PRN  Cornelia Copaharlie Cali Cuartas, Jr MD Attending Center for [facility](Faculty Practice)

## 2019-04-30 ENCOUNTER — Ambulatory Visit: Payer: Self-pay | Admitting: General Surgery

## 2019-05-14 ENCOUNTER — Other Ambulatory Visit: Payer: Self-pay

## 2019-05-14 ENCOUNTER — Ambulatory Visit (INDEPENDENT_AMBULATORY_CARE_PROVIDER_SITE_OTHER): Payer: Managed Care, Other (non HMO) | Admitting: General Surgery

## 2019-05-14 ENCOUNTER — Encounter: Payer: Self-pay | Admitting: General Surgery

## 2019-05-14 VITALS — BP 118/70 | HR 68 | Temp 97.7°F | Ht 64.0 in | Wt 135.0 lb

## 2019-05-14 DIAGNOSIS — K642 Third degree hemorrhoids: Secondary | ICD-10-CM | POA: Diagnosis not present

## 2019-05-14 DIAGNOSIS — E039 Hypothyroidism, unspecified: Secondary | ICD-10-CM | POA: Diagnosis not present

## 2019-05-14 NOTE — Patient Instructions (Signed)
Return as needed.The patient is aware to call back for any questions or concerns.  

## 2019-05-14 NOTE — Progress Notes (Signed)
Patient ID: Lindsay Underwood, female   DOB: 02/12/1981, 38 y.o.   MRN: [id number]  Chief Complaint  Patient presents with  . New Patient (Initial Visit)    hemorrhoids    HPI Lindsay Underwood is a 38 y.o. female.   Lindsay Underwood has been referred by Dr. Aletha Halim, her OB/GYN, for further evaluation of hemorrhoids.  Lindsay Underwood states that Lindsay Underwood has had hemorrhoids as long as Lindsay Underwood can remember.  They do not cause her pain unless Lindsay Underwood is quite constipated.  Lindsay Underwood says this only happens about once a month.  Lindsay Underwood resolves her constipation with a fiber supplement, such as Metamucil.  Lindsay Underwood does endorse some itching.  Lindsay Underwood has had a prior hemorrhoidectomy.  Lindsay Underwood uses suppositories as needed for symptomatic relief.  Lindsay Underwood denies frequent bleeding but does report having had a small amount in the past.  Lindsay Underwood states that Lindsay Underwood is concerned with hygiene of the area and is also a little bit self-conscious of there presence.  On an unrelated topic, Lindsay Underwood states that her thyroid is "off".  Lindsay Underwood has been evaluated by her primary care provider, Dr. Carlota Raspberry and found to be hypothyroid by biochemical evaluation.  Lindsay Underwood is currently not taking any thyroid hormone supplementation.  I do not see that Lindsay Underwood has been tested for Hashimoto's thyroiditis, however her most recent TSH was over 8.  Lindsay Underwood describes heavy menstrual bleeding as well as bleeding in between periods.  Lindsay Underwood denies significant weight gain but does endorse some mild fatigue.  Lindsay Underwood says that her hair falls out more than Lindsay Underwood would expect.   Past Medical History:  Diagnosis Date  . Anemia 2018  . Cystocele, midline   . Hemorrhoids   . Hypothyroidism   . Rectocele   . Urinary, incontinence, stress female 09/05/2016    Past Surgical History:  Procedure Laterality Date  . ANTERIOR AND POSTERIOR VAGINAL REPAIR     07/2017. done with h/s, D&C, BTL  . BILATERAL SALPINGECTOMY     laparoscopic  . HYSTEROSCOPY W/D&C      Family History  Problem Relation Age of Onset  . Diabetes Father    . Heart disease Father   . Huntington's disease Mother   . Huntington's disease Brother   . Sleep apnea Sister     Social History Social History   Tobacco Use  . Smoking status: Never Smoker  . Smokeless tobacco: Never Used  Substance Use Topics  . Alcohol use: Yes    Comment: occasional  . Drug use: No    No Known Allergies  Current Outpatient Medications  Medication Sig Dispense Refill  . cetirizine (ZYRTEC) 10 MG tablet Take 10 mg by mouth daily.    . montelukast (SINGULAIR) 10 MG tablet     . Multiple Vitamin (MULTI-VITAMINS) TABS Take by mouth.    . gabapentin (NEURONTIN) 100 MG capsule      No current facility-administered medications for this visit.     Review of Systems Review of Systems  All other systems reviewed and are negative.   Blood pressure 118/70, pulse 68, temperature 97.7 F (36.5 C), temperature source Skin, height 5\' 4"  (1.626 m), weight 135 lb (61.2 kg), SpO2 99 %.  Physical Exam Physical Exam Exam conducted with a chaperone present.  Constitutional:      General: Lindsay Underwood is not in acute distress.    Appearance: Normal appearance. Lindsay Underwood is normal weight.  HENT:     Head: Normocephalic and atraumatic.     Nose:  Comments: Covered with a mask secondary to COVID-19 precautions    Mouth/Throat:     Comments: Covered with a mask secondary to COVID-19 precautions Eyes:     General: No scleral icterus.       Right eye: No discharge.        Left eye: No discharge.     Conjunctiva/sclera: Conjunctivae normal.     Comments: No proptosis or exophthalmos  Neck:     Musculoskeletal: Normal range of motion.     Comments: Thyroid diffusely mildly enlarged.  No dominant masses appreciated.  The gland moves freely with deglutition. Cardiovascular:     Rate and Rhythm: Normal rate and regular rhythm.     Pulses: Normal pulses.  Pulmonary:     Effort: Pulmonary effort is normal.     Breath sounds: Normal breath sounds.  Abdominal:     General:  Abdomen is flat. Bowel sounds are normal.     Palpations: Abdomen is soft.  Genitourinary:    Exam position: Knee-chest position.       Comments: Grade 3 external hemorrhoids appreciated.  No stigmata of recent bleeding.  Digital rectal exam suggest recurrence of rectal prolapse/rectocele.  No internal masses or fluctuance identified. Musculoskeletal: Normal range of motion.        General: No swelling.     Right lower leg: No edema.     Left lower leg: No edema.  Lymphadenopathy:     Cervical: No cervical adenopathy.  Skin:    General: Skin is warm and dry.  Neurological:     General: No focal deficit present.     Mental Status: Lindsay Underwood is alert and oriented to person, place, and time.  Psychiatric:        Mood and Affect: Mood normal.        Behavior: Behavior normal.        Thought Content: Thought content normal.     Data Reviewed I reviewed the patient's laboratory studies and prior clinic notes, both [facility] with [facility] as well as the [facility], via care everywhere, and the [facility].  Dr. Vergie LivingPickens clinic note of November 19, 2018 describes 2 hemorrhoids on his exam.  These are still present on his exam of April 16, 2019.  Dr. Harold BarbanBabaof's notes were also reviewed.  His most recent is dated December 27, 2018.  This was a telemedicine visit.  Based upon his documentation, it sounds like Lindsay Underwood has not been prescribed thyroid hormone replacement due to her desire not to take medication and the fact that Lindsay Underwood does not think Lindsay Underwood is having significant symptoms.  Lindsay Underwood is actually quite hypothyroid with a TSH of 8.836 and a free T4 of 0.64 in the documentation from that visit.  Assessment This is a 38 year old patient who presented to me for evaluation of hemorrhoids.  During the course of our visit, we also discussed her hypothyroidism.  Lindsay Underwood does have mild symptoms attributable to her hemorrhoids, however Lindsay Underwood is not having significant pain or bleeding from them.  I  discussed hemorrhoid surgery with her today, including the possibility of recurrence, as well as the significant pain many people experience after the operation.    I also discussed her hypothyroidism.  Her menorrhagia and intermenstrual bleeding may very well be attributable to her hypothyroidism.  Lindsay Underwood also has cold intolerance and hair loss.  Over time, untreated hypothyroidism can lead to heart failure and as a result I strongly suggested that Lindsay Underwood consider  thyroid hormone replacement.  I would also recommend testing for Hashimoto's thyroiditis by way of thyroid peroxidase antibodies.  High levels of these antibodies are also associated with additional symptoms that can be ameliorated via thyroidectomy, however at this time, Lindsay Underwood is not endorsing any of these.  Plan For now, Lindsay Underwood would like to think about whether or not to pursue hemorrhoidectomy.  I think this is quite reasonable given her limited symptoms at this time.  Lindsay Underwood will contact her primary care provider and inquire further regarding initiation of thyroid hormone replacement.  Lindsay Underwood will contact me should Lindsay Underwood wish to pursue surgical intervention for her hemorrhoids.    Duanne GuessJennifer Brennen Gardiner 05/14/2019, 5:18 PM

## 2019-12-13 ENCOUNTER — Ambulatory Visit: Payer: Managed Care, Other (non HMO) | Attending: Internal Medicine

## 2019-12-13 DIAGNOSIS — Z23 Encounter for immunization: Secondary | ICD-10-CM

## 2019-12-13 NOTE — Progress Notes (Signed)
   Covid-19 Vaccination Clinic  Name:  Lindsay Underwood    MRN: [id number] DOB: 08-16-1981  12/13/2019  Lindsay Underwood was observed post Covid-19 immunization for 15 minutes without incident. She was provided with Vaccine Information Sheet and instruction to access the V-Safe system.   Lindsay Underwood was instructed to call 911 with any severe reactions post vaccine: Marland Kitchen Difficulty breathing  . Swelling of face and throat  . A fast heartbeat  . A bad rash all over body  . Dizziness and weakness   Immunizations Administered    Name Date Dose VIS Date Route   Pfizer COVID-19 Vaccine 12/13/2019  6:02 PM 0.3 mL 09/06/2019 Intramuscular   Manufacturer: [employer], [employer]   Lot: [id number]   NDC: [address]-4

## 2020-01-10 ENCOUNTER — Ambulatory Visit: Payer: Managed Care, Other (non HMO)

## 2020-01-15 ENCOUNTER — Ambulatory Visit: Payer: Managed Care, Other (non HMO) | Attending: Internal Medicine

## 2020-01-15 DIAGNOSIS — Z23 Encounter for immunization: Secondary | ICD-10-CM

## 2020-01-15 NOTE — Progress Notes (Signed)
   Covid-19 Vaccination Clinic  Name:  Tyjanae Bartek    MRN: [id number] DOB: [birth year]  01/15/2020  Ms. Mahurin was observed post Covid-19 immunization for 15 minutes without incident. She was provided with Vaccine Information Sheet and instruction to access the V-Safe system.   Ms. Hauck was instructed to call 911 with any severe reactions post vaccine: Marland Kitchen Difficulty breathing  . Swelling of face and throat  . A fast heartbeat  . A bad rash all over body  . Dizziness and weakness   Immunizations Administered    Name Date Dose VIS Date Route   Pfizer COVID-19 Vaccine 01/15/2020 12:13 PM 0.3 mL 11/20/2018 Intramuscular   Manufacturer: [employer], [employer]   Lot: [id number]   NDC: [address]-6

## 2020-04-28 ENCOUNTER — Ambulatory Visit: Payer: Managed Care, Other (non HMO) | Admitting: Obstetrics and Gynecology

## 2020-05-07 ENCOUNTER — Other Ambulatory Visit: Payer: Self-pay

## 2020-05-07 ENCOUNTER — Ambulatory Visit (INDEPENDENT_AMBULATORY_CARE_PROVIDER_SITE_OTHER): Payer: Managed Care, Other (non HMO) | Admitting: Obstetrics and Gynecology

## 2020-05-07 ENCOUNTER — Encounter: Payer: Self-pay | Admitting: Obstetrics and Gynecology

## 2020-05-07 VITALS — BP 110/70 | HR 70 | Ht 64.0 in | Wt 135.0 lb

## 2020-05-07 DIAGNOSIS — Z01419 Encounter for gynecological examination (general) (routine) without abnormal findings: Secondary | ICD-10-CM

## 2020-05-07 DIAGNOSIS — N920 Excessive and frequent menstruation with regular cycle: Secondary | ICD-10-CM

## 2020-05-07 DIAGNOSIS — N923 Ovulation bleeding: Secondary | ICD-10-CM

## 2020-05-07 NOTE — Progress Notes (Addendum)
Obstetrics and Gynecology Annual Patient Evaluation  Appointment Date: 05/07/2020  OBGYN Clinic: [facility] for [facility]  Primary Care Provider: Kandyce Underwood Chief Complaint:  Chief Complaint  Patient presents with  . Gynecologic Exam    History of Present Illness: Lindsay Underwood is a 39 y.o. Caucasian G2P2002 ([name] last menstrual period was 04/27/2020 (exact date).), seen for the above chief complaint.   Improved intermenstrual spotting with the starting the synthroid.    Review of Systems: Pertinent items noted in HPI and remainder of comprehensive ROS otherwise negative.   Past Medical History:  Past Medical History:  Diagnosis Date  . Anemia 2018  . Cystocele, midline   . Hemorrhoids   . Hypothyroidism   . Rectocele   . Urinary, incontinence, stress female 09/05/2016    Past Surgical History:  Past Surgical History:  Procedure Laterality Date  . ANTERIOR AND POSTERIOR VAGINAL REPAIR     07/2017. done with h/s, D&C, BTL  . BILATERAL SALPINGECTOMY     laparoscopic  . HYSTEROSCOPY WITH D & C      Past Obstetrical History:  OB History  Gravida Para Term Preterm AB Living  2 2 2     2   SAB TAB Ectopic Multiple Live Births          2    # Outcome Date GA Lbr Len/2nd Weight Sex Delivery Anes PTL Lv  2 Term 05/26/12 [redacted]w[redacted]d  6 lb 5 oz (2.863 kg) M Vag-Spont   LIV  1 Term 07/19/09 [redacted]w[redacted]d  6 lb 1 oz (2.75 kg) M Vag-Spont   LIV    Obstetric Comments  Menstrual age: 41    Age 1st Pregnancy: 26    Past Gynecological History: As per HPI. Periods: qmonth, regular, that is somewhat heavy or painful, 1 week History of Pap Smear(s): Yes.   Last pap 2019, which was negative and hpv neg She is currently using bilateral tubal ligation for contraception.   Social History:  Social History   Socioeconomic History  . Marital status: Married    Spouse name: Not on file  . Number of children: Not on file  . Years of education: Not on file  . Highest  education level: Not on file  Occupational History  . Not on file  Tobacco Use  . Smoking status: Never Smoker  . Smokeless tobacco: Never Used  Vaping Use  . Vaping Use: Never used  Substance and Sexual Activity  . Alcohol use: Yes    Comment: occasional  . Drug use: No  . Sexual activity: Yes    Birth control/protection: None  Other Topics Concern  . Not on file  Social History Narrative  . Not on file   Social Determinants of Health   Financial Resource Strain:   . Difficulty of Paying Living Expenses:   Food Insecurity:   . Worried About 2020 in the Last Year:   . Programme researcher, broadcasting/film/video in the Last Year:   Transportation Needs:   . Barista (Medical):   Freight forwarder Lack of Transportation (Non-Medical):   Physical Activity:   . Days of Exercise per Week:   . Minutes of Exercise per Session:   Stress:   . Feeling of Stress :   Social Connections:   . Frequency of Communication with Friends and Family:   . Frequency of Social Gatherings with Friends and Family:   . Attends Religious Services:   . Active Member of Clubs or Organizations:   .  Attends Banker Meetings:   Marland Kitchen Marital Status:   Intimate Partner Violence:   . Fear of Current or Ex-Partner:   . Emotionally Abused:   Marland Kitchen Physically Abused:   . Sexually Abused:     Family History:  Family History  Problem Relation Age of Onset  . Diabetes Father   . Heart disease Father   . Huntington's disease Mother   . Huntington's disease Brother   . Sleep apnea Sister     Medications Lindsay Underwood had no medications administered during this visit. Current Outpatient Medications  Medication Sig Dispense Refill  . cetirizine (ZYRTEC) 10 MG tablet Take 10 mg by mouth daily.    Marland Kitchen levothyroxine (SYNTHROID) 25 MCG tablet Take 25 mcg by mouth daily before breakfast.    . montelukast (SINGULAIR) 10 MG tablet     . Multiple Vitamin (MULTI-VITAMINS) TABS Take by mouth.     No current  facility-administered medications for this visit.    Allergies Patient has no known allergies.   Physical Exam:  BP 110/70   Pulse 70   Ht 5\' 4"  (1.626 m)   Wt 135 lb (61.2 kg)   LMP 04/27/2020 (Exact Date)   BMI 23.17 kg/m  Body mass index is 23.17 kg/m. General appearance: Well nourished, well developed female in no acute distress.  Neck:  Supple, normal appearance, and no thyromegaly  Cardiovascular: normal s1 and s2.  No murmurs, rubs or gallops. Respiratory:  Clear to auscultation bilateral. Normal respiratory effort Abdomen: positive bowel sounds and no masses, hernias; diffusely non tender to palpation, non distended Breasts: breasts appear normal, no suspicious masses, no skin or nipple changes or axillary nodes, and normal palpation. Neuro/Psych:  Normal mood and affect.  Skin:  Warm and dry.  Lymphatic:  No inguinal lymphadenopathy.   Pelvic exam: is not limited by body habitus EGBUS: within normal limits Vagina: within normal limits and with no blood or discharge in the vault. Stable cystocele with Aa to -1 to -2. Slight rectocele.  Cervix: normal appearing cervix without tenderness, discharge or lesions. Uterus:  nonenlarged and non tender Adnexa:  normal adnexa and no mass, fullness, tenderness Rectovaginal: deferred  Laboratory: none  Radiology: none  Assessment: pt doing well  Plan:  1. Well woman exam Continue with PCP titration of synthroid that I d/w her may help with her intermenstrual spotting but likely not her periods. Patient would like to do exp management for her menorrhagia, bleeding  RTC PRN  06/27/2020 MD Attending Center for [facility])
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
## 2021-05-17 ENCOUNTER — Encounter: Payer: Self-pay | Admitting: Obstetrics and Gynecology

## 2021-05-17 ENCOUNTER — Ambulatory Visit (INDEPENDENT_AMBULATORY_CARE_PROVIDER_SITE_OTHER): Payer: Managed Care, Other (non HMO) | Admitting: Obstetrics and Gynecology

## 2021-05-17 ENCOUNTER — Other Ambulatory Visit: Payer: Self-pay

## 2021-05-17 VITALS — BP 93/61 | HR 68 | Ht 64.0 in | Wt 133.2 lb

## 2021-05-17 DIAGNOSIS — N940 Mittelschmerz: Secondary | ICD-10-CM | POA: Diagnosis not present

## 2021-05-17 DIAGNOSIS — N920 Excessive and frequent menstruation with regular cycle: Secondary | ICD-10-CM | POA: Diagnosis not present

## 2021-05-17 DIAGNOSIS — N946 Dysmenorrhea, unspecified: Secondary | ICD-10-CM | POA: Diagnosis not present

## 2021-05-17 DIAGNOSIS — Z01419 Encounter for gynecological examination (general) (routine) without abnormal findings: Secondary | ICD-10-CM

## 2021-05-17 NOTE — Progress Notes (Signed)
Obstetrics and Gynecology Annual Patient Evaluation   Appointment Date: 05/17/2021   OBGYN Clinic: [facility] for [facility]   Primary Care Provider: Kandyce Rud  Chief Complaint: Annual GYN exam   History of Present Illness: Lindsay Underwood is a 40 y.o. Caucasian G2P2002 ([name] last menstrual period was 04/28/2021 (exact date).), seen for the above chief complaint. PMHx significant for a/p repair, hysteroscopy, d&c, BTL; hemorrhoids; hypothyroidism; menorrhagia and dysmenorrhea   Patient with continue intermenstrual spotting two weeks after period starts, lasts one day and some discomfort. Periods are still somewhat heavy and painful.      Review of Systems: Pertinent items noted in HPI and remainder of comprehensive ROS otherwise negative.    Past Medical History:      Past Medical History:  Diagnosis Date   Anemia 2018   Cystocele, midline     Hemorrhoids     Hypothyroidism     Rectocele     Urinary, incontinence, stress female 09/05/2016      Past Surgical History:       Past Surgical History:  Procedure Laterality Date   ANTERIOR AND POSTERIOR VAGINAL REPAIR        07/2017. done with h/s, D&C, BTL   BILATERAL SALPINGECTOMY        laparoscopic   HYSTEROSCOPY WITH D & C          Past Obstetrical History:                  OB History  Gravida Para Term Preterm AB Living  2 2 2     2   SAB TAB Ectopic Multiple Live Births             2        # Outcome Date GA Lbr Len/2nd Weight Sex Delivery Anes PTL Lv  2 Term 05/26/12 [redacted]w[redacted]d   6 lb 5 oz (2.863 kg) M Vag-Spont     LIV  1 Term 07/19/09 [redacted]w[redacted]d   6 lb 1 oz (2.75 kg) M Vag-Spont     LIV     Obstetric Comments  Menstrual age: 82     Age 1st Pregnancy: 23      Past Gynecological History: As per HPI. Periods: qmonth, regular, that is somewhat heavy or painful, 1 week History of Pap Smear(s): Yes.   Last pap 2019, which was negative and hpv neg She is currently using bilateral tubal ligation for  contraception.    Social History:  Social History         Socioeconomic History   Marital status: Married      Spouse name: Not on file   Number of children: Not on file   Years of education: Not on file   Highest education level: Not on file  Occupational History   Not on file  Tobacco Use   Smoking status: Never Smoker   Smokeless tobacco: Never Used  Vaping Use   Vaping Use: Never used  Substance and Sexual Activity   Alcohol use: Yes      Comment: occasional   Drug use: No   Sexual activity: Yes      Birth control/protection: None  Other Topics Concern   Not on file  Social History Narrative   Not on file    Social Determinants of Health       Financial Resource Strain:    Difficulty of Paying Living Expenses:   Food Insecurity:    Worried About 2020 in the  Last Year:    Barista in the Last Year:   Transportation Needs:    Freight forwarder (Medical):    Lack of Transportation (Non-Medical):   Physical Activity:    Days of Exercise per Week:    Minutes of Exercise per Session:   Stress:    Feeling of Stress :   Social Connections:    Frequency of Communication with Friends and Family:    Frequency of Social Gatherings with Friends and Family:    Attends Religious Services:    Active Member of Clubs or Organizations:    Attends Engineer, structural:    Marital Status:   Intimate Partner Violence:    Fear of Current or Ex-Partner:    Emotionally Abused:    Physically Abused:    Sexually Abused:       Family History:       Family History  Problem Relation Age of Onset   Diabetes Father     Heart disease Father     Huntington's disease Mother     Huntington's disease Brother     Sleep apnea Sister        Medications Lindsay Underwood had no medications administered during this visit.       Current Outpatient Medications  Medication Sig Dispense Refill   cetirizine (ZYRTEC) 10 MG tablet Take 10 mg by mouth  daily.       levothyroxine (SYNTHROID) 25 MCG tablet Take 25 mcg by mouth daily before breakfast.       montelukast (SINGULAIR) 10 MG tablet         Multiple Vitamin (MULTI-VITAMINS) TABS Take by mouth.        No current facility-administered medications for this visit.      Allergies Patient has no known allergies.     Physical Exam:  BP 93/61   Pulse 68   Ht 5\' 4"  (1.626 m)   Wt 133 lb (61.2 kg)   LMP 04/28/2021 (Exact Date)   Body mass index is 22.86 kg/m.  General appearance: Well nourished, well developed female in no acute distress.  Neck:  Supple, normal appearance, and no thyromegaly  Cardiovascular: normal s1 and s2.  No murmurs, rubs or gallops. Respiratory:  Clear to auscultation bilateral. Normal respiratory effort Abdomen: positive bowel sounds and no masses, hernias; diffusely non tender to palpation, non distended Breasts: breasts appear normal, no suspicious masses, no skin or nipple changes or axillary nodes, and normal palpation. Neuro/Psych:  Normal mood and affect.  Skin:  Warm and dry.  Lymphatic:  No inguinal lymphadenopathy.    Pelvic exam: is not limited by body habitus EGBUS: within normal limits Vagina: within normal limits and with no blood or discharge in the vault. Stable cystocele with Aa to -2. Slight rectocele.  Cervix: normal appearing cervix without tenderness, discharge or lesions. Uterus:  nonenlarged and non tender Adnexa:  normal adnexa and no mass, fullness, tenderness Rectum: two small hemorrhoids, nttp Rectovaginal: deferred   Laboratory: none   Radiology: none   Assessment: pt doing well   Plan:  1. Well woman exam I d/w her that her spotting sounds pathnogomic for mittleschmerz and no need for any intervention if it is not distressing to her. I told her that if she desired intervention that hormones to help control her cycle would be first line. I also told her that interventions would also help with her menorrhagia and  dysmenorrhea. She is not desiring anything for the intermenstrual  spotting. I told her that she could also consider cyclic Lysteda to help with her periods with r/b d/w her. Pt to consider  Stable urogyn exam. Pt still feels some prolapse s/s on the posterior aspect; pt okay with exp management.   2. Hemorrhoids S/p GSU consult in the past. She states that they told her that they appeared as if they would come back if they were removed. This was several years ago and I told her if she'd like to have a reassessment to let me know  RTC PRN   Cornelia Copa MD Attending Center for [employer] [facility]

## 2021-05-17 NOTE — Patient Instructions (Signed)
Lysteda to help with your periods

## 2021-06-22 ENCOUNTER — Encounter: Payer: Self-pay | Admitting: General Surgery

## 2021-08-05 ENCOUNTER — Other Ambulatory Visit: Payer: Self-pay

## 2021-08-05 ENCOUNTER — Telehealth: Payer: Self-pay

## 2021-08-05 DIAGNOSIS — N898 Other specified noninflammatory disorders of vagina: Secondary | ICD-10-CM

## 2021-08-05 MED ORDER — FLUCONAZOLE 150 MG PO TABS: 150.0000 mg | ORAL_TABLET | Freq: Once | ORAL | 0 refills | Status: AC

## 2021-08-05 NOTE — Telephone Encounter (Signed)
TC to pt to make her aware Rx was sent for yeast per protocol . Pt voiced understanding.

## 2021-08-05 NOTE — Progress Notes (Signed)
Pt requested Rx for yeast  Sent per protocol.

## 2021-08-16 ENCOUNTER — Telehealth: Payer: Self-pay

## 2021-08-16 NOTE — Telephone Encounter (Signed)
TC to pt regarding TC on 08/13/21 while office was closed regarding "Rx on file to be sent" pt not ava LVM for pt to return call back to office.

## 2021-08-17 ENCOUNTER — Other Ambulatory Visit: Payer: Self-pay | Admitting: Obstetrics and Gynecology

## 2021-08-17 ENCOUNTER — Telehealth: Payer: Self-pay

## 2021-08-17 MED ORDER — TRANEXAMIC ACID 650 MG PO TABS: 1300.0000 mg | ORAL_TABLET | Freq: Three times a day (TID) | ORAL | 2 refills | Status: DC

## 2021-08-17 NOTE — Telephone Encounter (Signed)
TC to pt to make aware I consulted w/ Dr.Pickens and Rx for Delanna Notice has been sent to her pharmacy on file.  Pt voiced understanding.

## 2021-12-02 ENCOUNTER — Ambulatory Visit (INDEPENDENT_AMBULATORY_CARE_PROVIDER_SITE_OTHER): Payer: Managed Care, Other (non HMO) | Admitting: Advanced Practice Midwife

## 2021-12-02 ENCOUNTER — Other Ambulatory Visit (HOSPITAL_COMMUNITY)
Admission: RE | Admit: 2021-12-02 | Discharge: 2021-12-02 | Disposition: A | Discharge status: Home / Self Care | Payer: Managed Care, Other (non HMO) | Source: Ambulatory Visit | Attending: Advanced Practice Midwife | Admitting: Advanced Practice Midwife

## 2021-12-02 ENCOUNTER — Other Ambulatory Visit: Payer: Self-pay

## 2021-12-02 VITALS — BP 102/68 | HR 84 | Wt 132.0 lb

## 2021-12-02 DIAGNOSIS — N898 Other specified noninflammatory disorders of vagina: Secondary | ICD-10-CM

## 2021-12-02 DIAGNOSIS — N923 Ovulation bleeding: Secondary | ICD-10-CM

## 2021-12-03 ENCOUNTER — Encounter: Payer: Self-pay | Admitting: Advanced Practice Midwife

## 2021-12-03 NOTE — Progress Notes (Signed)
? ? ?GYNECOLOGY PROBLEM CARE ENCOUNTER NOTE ? ?History:    ? Lindsay Underwood is a 41 y.o. G33P2002 female here for evaluation of recurrent abnormal vaginal discharge and vulvar irritation.  Patient suspects that she has a yeast infection but denies seeing thick white clusters of discharge. She reports being given Diflucan for her onset of symptoms, which then almost completely resolved. However she continues to have to self-treat with short course treatments including OTC Monistate. She endorses itching, swelling of her vulva and tenderness.  ? ?Gyn history is significant for intermenstrual spotting but this is not a problem for her and she is not tracking those symptoms. ?  ?Gynecologic History ?[name] last menstrual period was 11/21/2021 (exact date). ?Contraception:  s/p bilateral salpingectomy ?Last Pap: 02/2018. Result was normal with negative HPV ?Last Mammogram: N/A.   ? ?Obstetric History ?OB History  ?Gravida Para Term Preterm AB Living  ?2 2 2     2   ?SAB IAB Ectopic Multiple Live Births  ?        2  ?  ?# Outcome Date GA Lbr Len/2nd Weight Sex Delivery Anes PTL Lv  ?2 Term 05/26/12 [redacted]w[redacted]d  6 lb 5 oz (2.863 kg) M Vag-Spont   LIV  ?1 Term 07/19/09 [redacted]w[redacted]d  6 lb 1 oz (2.75 kg) M Vag-Spont   LIV  ?  ?Obstetric Comments  ?Menstrual age: 47  ?  ?Age 1st Pregnancy: 49  ? ? ?Past Medical History:  ?Diagnosis Date  ? Anemia 2018  ? Cystocele, midline   ? Hemorrhoids   ? Hypothyroidism   ? Rectocele   ? Urinary, incontinence, stress female 09/05/2016  ? ? ?Past Surgical History:  ?Procedure Laterality Date  ? ANTERIOR AND POSTERIOR VAGINAL REPAIR    ? 07/2017. done with h/s, D&C, BTL  ? BILATERAL SALPINGECTOMY    ? laparoscopic  ? HYSTEROSCOPY WITH D & C    ? ? ?Current Outpatient Medications on File Prior to Visit  ?Medication Sig Dispense Refill  ? cetirizine (ZYRTEC) 10 MG tablet Take 10 mg by mouth daily.    ? fluticasone (FLONASE) 50 MCG/ACT nasal spray Place 1 spray into both nostrils daily as needed for  allergies or rhinitis.    ? levothyroxine (SYNTHROID) 25 MCG tablet Take 25 mcg by mouth daily before breakfast.    ? montelukast (SINGULAIR) 10 MG tablet     ? Multiple Vitamin (MULTI-VITAMINS) TABS Take by mouth.    ? tranexamic acid (LYSTEDA) 650 MG TABS tablet Take 2 tablets (1,300 mg total) by mouth 3 (three) times daily. Take during menses for a maximum of five days 30 tablet 2  ? ?No current facility-administered medications on file prior to visit.  ? ? ?No Known Allergies ? ?Social History:  reports that she has never smoked. She has never used smokeless tobacco. She reports current alcohol use. She reports that she does not use drugs. ? ?Family History  ?Problem Relation Age of Onset  ? Diabetes Father   ? Heart disease Father   ? Huntington's disease Mother   ? Huntington's disease Brother   ? Sleep apnea Sister   ? ? ?The following portions of the patient's history were reviewed and updated as appropriate: allergies, current medications, past family history, past medical history, past social history, past surgical history and problem list. ? ?Review of Systems ?Pertinent items noted in HPI and remainder of comprehensive ROS otherwise negative. ? ?Physical Exam:  ?BP 102/68   Pulse 84   Wt  132 lb (59.9 kg)   LMP 11/21/2021 (Exact Date)   BMI 22.66 kg/m?  ?CONSTITUTIONAL: Well-developed, well-nourished female in no acute distress.  ?HENT:  Normocephalic, atraumatic, External right and left ear normal. . ?NEUROLOGIC: Alert and oriented to person, place, and time. Normal reflexes, muscle tone coordination.  ?PSYCHIATRIC: Normal mood and affect. Normal behavior. Normal judgment and thought content. ?CARDIOVASCULAR: Normal heart rate noted, regular rhythm ?RESPIRATORY: Clear to auscultation bilaterally. Effort and breath sounds normal, no problems with respiration noted. ?PELVIC: Grossly normal appearing external genitalia and urethral meatus; slightly red vaginal mucosa.  CV swab collected via blind swab.  Performed in the presence of a chaperone. ?  ?Assessment and Plan:  ?  1. Vaginal discharge ?- Presentation not c/w yeast infection ?- Patient agreeable to waiting for swab results ?- Cervicovaginal ancillary only ? ?2. Intermenstrual bleeding ? ? ?Patient due for pap but has great rapport and prolonged care history with Dr. Vergie Living and will schedule with him at her convenience. ? ?Routine preventative health maintenance measures emphasized. ?Please refer to After Visit Summary for other counseling recommendations.  ?   ? ?Clayton Bibles, MSA, MSN, CNM ?Certified Nurse Midwife, Faculty Practice ?Center for [employer], [facility] ? ? ?

## 2021-12-06 ENCOUNTER — Other Ambulatory Visit: Payer: Self-pay | Admitting: *Deleted

## 2021-12-06 LAB — CERVICOVAGINAL ANCILLARY ONLY
Bacterial Vaginitis (gardnerella): NEGATIVE
Candida Glabrata: NEGATIVE
Candida Vaginitis: POSITIVE — AB
Comment: NEGATIVE
Comment: NEGATIVE
Comment: NEGATIVE

## 2021-12-06 MED ORDER — FLUCONAZOLE 150 MG PO TABS: 150.0000 mg | ORAL_TABLET | Freq: Once | ORAL | 3 refills | Status: AC

## 2022-05-17 ENCOUNTER — Encounter: Payer: Self-pay | Admitting: Obstetrics and Gynecology

## 2022-05-17 ENCOUNTER — Ambulatory Visit (INDEPENDENT_AMBULATORY_CARE_PROVIDER_SITE_OTHER): Payer: Managed Care, Other (non HMO) | Admitting: Obstetrics and Gynecology

## 2022-05-17 ENCOUNTER — Other Ambulatory Visit (HOSPITAL_COMMUNITY)
Admission: RE | Admit: 2022-05-17 | Discharge: 2022-05-17 | Disposition: A | Discharge status: Home / Self Care | Payer: Managed Care, Other (non HMO) | Source: Ambulatory Visit | Attending: Obstetrics and Gynecology | Admitting: Obstetrics and Gynecology

## 2022-05-17 VITALS — BP 98/64 | HR 73 | Wt 133.0 lb

## 2022-05-17 DIAGNOSIS — N920 Excessive and frequent menstruation with regular cycle: Secondary | ICD-10-CM

## 2022-05-17 DIAGNOSIS — Z202 Contact with and (suspected) exposure to infections with a predominantly sexual mode of transmission: Secondary | ICD-10-CM | POA: Diagnosis not present

## 2022-05-17 DIAGNOSIS — Z01419 Encounter for gynecological examination (general) (routine) without abnormal findings: Secondary | ICD-10-CM

## 2022-05-17 DIAGNOSIS — N923 Ovulation bleeding: Secondary | ICD-10-CM

## 2022-05-17 DIAGNOSIS — Z124 Encounter for screening for malignant neoplasm of cervix: Secondary | ICD-10-CM

## 2022-05-17 DIAGNOSIS — N8110 Cystocele, unspecified: Secondary | ICD-10-CM | POA: Insufficient documentation

## 2022-05-17 NOTE — Progress Notes (Signed)
Obstetrics and Gynecology Annual Patient Evaluation  Appointment Date: 05/17/2022  OBGYN Clinic: [facility] for [facility]   Primary Care Provider: Kandyce Rud  Chief Complaint:  Chief Complaint  Patient presents with   Gynecologic Exam    History of Present Illness: Lindsay Underwood is a 41 y.o. U2G2542, seen for the above chief complaint.   Patient would like STD screening due to concern for fidelity  Stable intermenstrual spotting feels this is related to thyroid issues as she had no s/s when her thyroid was normal; still stable somewhat heavy and painful periods but still qmonth, regular, approximately on week. She's used the Lysteda PRN and this has worked well  Feels prolapse is stable  Review of Systems: Pertinent items noted in HPI and remainder of comprehensive ROS otherwise negative.    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 done with h/s, D&C, BTL   BILATERAL SALPINGECTOMY     laparoscopic   HYSTEROSCOPY WITH D & C      Past Obstetrical History:  OB History  Gravida Para Term Preterm AB Living  2 2 2     2   SAB IAB Ectopic Multiple Live Births          2    # Outcome Date GA Lbr Len/2nd Weight Sex Delivery Anes PTL Lv  2 Term 05/26/12 [redacted]w[redacted]d  6 lb 5 oz (2.863 kg) M Vag-Spont   LIV  1 Term 07/19/09 [redacted]w[redacted]d  6 lb 1 oz (2.75 kg) M Vag-Spont   LIV    Obstetric Comments  Menstrual age: 64     Age 1st Pregnancy: 46    Past Gynecological History: As per HPI. History of Pap Smear(s): Yes.   Last pap 2019, which was cytology and hpv negative.   Social History:  Social History   Socioeconomic History   Marital status: Married    Spouse name: Not on file   Number of children: Not on file   Years of education: Not on file   Highest education level: Not on file  Occupational History   Not on file  Tobacco Use   Smoking status: Never   Smokeless tobacco: Never  Vaping Use   Vaping Use: Never used  Substance and Sexual Activity   Alcohol use: Yes    Comment: occasional   Drug use: No   Sexual activity: Yes    Birth control/protection: None  Other Topics Concern   Not on file  Social History Narrative   Not on file   Social Determinants of Health   Financial Resource Strain: Not on file  Food Insecurity: Not on file  Transportation Needs: Not on file  Physical Activity: Not on file  Stress: Not on file  Social Connections: Not on file  Intimate Partner Violence: Not on file    Family History:  Family History  Problem Relation Age of Onset   Diabetes Father  Heart disease Father    Huntington's disease Mother    Huntington's disease Brother    Sleep apnea Sister     Medications Lindsay Underwood had no medications administered during this visit. Current Outpatient Medications  Medication Sig Dispense Refill   cetirizine (ZYRTEC) 10 MG tablet Take 10 mg by mouth daily.     fluticasone (FLONASE) 50 MCG/ACT nasal spray Place 1 spray into both nostrils daily as needed for allergies or rhinitis.     levothyroxine (SYNTHROID) 25 MCG tablet Take 25 mcg by mouth daily before breakfast.     montelukast (SINGULAIR) 10 MG tablet      Multiple Vitamin (MULTI-VITAMINS) TABS Take by mouth.     tranexamic acid (LYSTEDA) 650 MG TABS tablet Take 2 tablets (1,300 mg total) by mouth 3 (three) times daily. Take during menses for a maximum of five days 30 tablet 2   No  current facility-administered medications for this visit.    Allergies Patient has no known allergies.   Physical Exam:  BP 98/64   Pulse 73   Wt 133 lb (60.3 kg)   BMI 22.83 kg/m  Body mass index is 22.83 kg/m. General appearance: Well nourished, well developed female in no acute distress.  Neck:  Supple, normal appearance, and no thyromegaly  Cardiovascular: normal s1 and s2.  No murmurs, rubs or gallops. Respiratory:  Clear to auscultation bilateral. Normal respiratory effort Abdomen: positive bowel sounds and no masses, hernias; diffusely non tender to palpation, non distended Breasts: breasts appear normal, no suspicious masses, no skin or nipple changes or axillary nodes, and normal palpation. Neuro/Psych:  Normal mood and affect.  Skin:  Warm and dry.  Lymphatic:  No inguinal lymphadenopathy.   Pelvic exam: is not limited by body habitus EGBUS: within normal limits Vagina: within normal limits and with no blood or discharge in the vault; On valsalva, stable cystocele with Aa to -2, slight rectocele Cervix: normal appearing cervix without tenderness, discharge or lesions. Uterus:  nonenlarged and non tender Adnexa:  normal adnexa and no mass, fullness, tenderness Rectovaginal: deferred  Laboratory: none  Radiology: none  Assessment: pt stable  Plan:  1. STD exposure - Cytology - PAP - RPR+HBsAg+HCVAb+...  2. Cervical cancer screening - Cytology - PAP  3. Well woman exam with routine gynecological exam  4. Vaginal wall prolapse Stable  5. Intermenstrual bleeding D/w her re: u/s to eval for any polyps; prior hysteroscopy d&c negative. Pt desires to hold off for now  6. Menorrhagia with regular cycle Satisfied with PRN Lysteda   RTC PRN  Cornelia Copa MD Attending Center for [facility])

## 2022-05-17 NOTE — Progress Notes (Signed)
Think she did have a cyst rupture this past year

## 2022-05-18 LAB — RPR+HBSAG+HCVAB+...
HIV Screen 4th Generation wRfx: NONREACTIVE
Hep C Virus Ab: NONREACTIVE
Hepatitis B Surface Ag: NEGATIVE
RPR Ser Ql: NONREACTIVE

## 2022-05-25 LAB — CYTOLOGY - PAP
Chlamydia: NEGATIVE
Comment: NEGATIVE
Comment: NEGATIVE
Comment: NEGATIVE
Comment: NORMAL
Diagnosis: NEGATIVE
Diagnosis: REACTIVE
High risk HPV: NEGATIVE
Neisseria Gonorrhea: NEGATIVE
Trichomonas: NEGATIVE

## 2022-08-16 ENCOUNTER — Other Ambulatory Visit: Payer: Self-pay | Admitting: *Deleted

## 2022-08-16 MED ORDER — FLUCONAZOLE 150 MG PO TABS: 150.0000 mg | ORAL_TABLET | Freq: Once | ORAL | 3 refills | Status: AC

## 2022-09-05 ENCOUNTER — Other Ambulatory Visit (HOSPITAL_BASED_OUTPATIENT_CLINIC_OR_DEPARTMENT_OTHER): Payer: Self-pay | Admitting: Family Medicine

## 2022-09-05 DIAGNOSIS — Z1231 Encounter for screening mammogram for malignant neoplasm of breast: Secondary | ICD-10-CM

## 2022-09-07 ENCOUNTER — Ambulatory Visit (HOSPITAL_BASED_OUTPATIENT_CLINIC_OR_DEPARTMENT_OTHER)
Admission: RE | Admit: 2022-09-07 | Discharge: 2022-09-07 | Disposition: A | Discharge status: Home / Self Care | Payer: Managed Care, Other (non HMO) | Source: Ambulatory Visit

## 2022-09-07 DIAGNOSIS — Z1231 Encounter for screening mammogram for malignant neoplasm of breast: Secondary | ICD-10-CM

## 2023-02-10 ENCOUNTER — Other Ambulatory Visit: Payer: Self-pay

## 2023-02-10 MED ORDER — FLUCONAZOLE 150 MG PO TABS: 150.0000 mg | ORAL_TABLET | Freq: Once | ORAL | 3 refills | Status: AC

## 2023-02-10 NOTE — Progress Notes (Signed)
Rx sent per protocol for yeast  Pt will need self swab if sx's return.

## 2023-02-24 ENCOUNTER — Other Ambulatory Visit (HOSPITAL_COMMUNITY)
Admission: RE | Admit: 2023-02-24 | Discharge: 2023-02-24 | Disposition: A | Discharge status: Home / Self Care | Payer: Managed Care, Other (non HMO) | Source: Ambulatory Visit | Attending: Obstetrics and Gynecology | Admitting: Obstetrics and Gynecology

## 2023-02-24 ENCOUNTER — Ambulatory Visit: Payer: Managed Care, Other (non HMO) | Admitting: Obstetrics and Gynecology

## 2023-02-24 ENCOUNTER — Encounter: Payer: Self-pay | Admitting: Obstetrics and Gynecology

## 2023-02-24 VITALS — BP 109/74 | HR 62 | Wt 133.0 lb

## 2023-02-24 DIAGNOSIS — N9089 Other specified noninflammatory disorders of vulva and perineum: Secondary | ICD-10-CM | POA: Diagnosis not present

## 2023-02-24 DIAGNOSIS — N939 Abnormal uterine and vaginal bleeding, unspecified: Secondary | ICD-10-CM | POA: Diagnosis present

## 2023-02-24 HISTORY — PX: BIOPSY VULVA: PRO42

## 2023-02-24 MED ORDER — NORETHIN ACE-ETH ESTRAD-FE 1-20 MG-MCG(24) PO TABS: 1.0000 | ORAL_TABLET | Freq: Every day | ORAL | 1 refills | Status: DC

## 2023-02-24 NOTE — Procedures (Signed)
Left Vulva Biopsy Procedure Note  Pre-operative Diagnosis: Left labia minora skin tag. Discomfort with manipulation  Post-operative Diagnosis: same  Procedure Details  Exam performed in the presence of a chaperone The risks and benefits of the procedure were explained to the patient and Written informed consent was obtained.  The patient was placed in the dorsal lithotomy position. The area was cleaned with betadine and then 0.33mL of 1% lidocaine with epi injected. Using sharp scissors, the skin tag was removed at the base and sent to pathology. Silver nitrate applied to biopsy bed; excellent hemostasis noted  Condition: Stable  Complications: None  Plan: F/u final pathology. Pelvic rest x 2 weeks  Cornelia Copa MD Attending Center for [employer] [facility])

## 2023-02-24 NOTE — Progress Notes (Signed)
RGYN patient here for IUD Insertion wants IUD that will stop periods.   Last pap: 05/17/22 WNL   LMP: has been bleeding for 3 wks

## 2023-02-24 NOTE — Progress Notes (Signed)
Obstetrics and Gynecology Visit Return Patient Evaluation  Appointment Date: 02/24/2023  Primary Care Provider: Kandyce Rud  OBGYN Clinic: Center for [facility]   Chief Complaint: chronic intermenstrual bleeding, skin tag irritation at the vulvar  History of Present Illness:  Lindsay Underwood is a 42 y.o. with above CC. PMHx significant for a/p repair, hysteroscopy, d&c (proliferative endometrium on path, neg on hysteroscopy), BTL; hemorrhoids; hypothyroidism; menorrhagia and dysmenorrhea   Patient has long h/o AUB. She has bleeding about half way through her monthly cycle but it has gotten worse. It is usually 2-3 days but it is now about 4-5 days like her period. She states her thyroid levels have been stable, as well as her dosing.   Pap and STD swab testing negative 04/2022  Patient is interested in Mirena to help with the AUB  Patient notes skin tag at the lower right area of the vaginal introitus.   Review of Systems: as noted in the History of Present Illness.  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 Current Outpatient Medications  Medication Sig Dispense Refill   cetirizine (ZYRTEC) 10 MG tablet Take 10 mg by mouth daily.     fluticasone (FLONASE) 50 MCG/ACT nasal spray Place 1 spray into both nostrils daily as needed for allergies or rhinitis.     levothyroxine (SYNTHROID) 25 MCG tablet Take 25 mcg by mouth daily before breakfast.     montelukast (SINGULAIR) 10 MG tablet      Multiple Vitamin (MULTI-VITAMINS) TABS Take by mouth.     Norethindrone Acetate-Ethinyl Estrad-FE (LOESTRIN 24 FE) 1-20 MG-MCG(24) tablet Take 1 tablet by mouth daily. 90 tablet 1    sertraline (ZOLOFT) 50 MG tablet Take 100 mg by mouth daily.     No current facility-administered medications for this visit.    Allergies: has No Known Allergies.  Physical Exam:  BP 109/74   Pulse 62   Wt 133 lb (60.3 kg)   BMI 22.83 kg/m  Body mass index is 22.83 kg/m. General appearance: Well nourished, well developed female in no acute distress.  Abdomen: diffusely non tender to palpation, non distended, and no masses, hernias Neuro/Psych:  Normal mood and affect.   At least 3 external hemorrhoids noted ranging from 1-2.5cm, non-inflamed  Pelvic exam:   exam performed in the presence of a chaperone EGBUS: wnl. Small, 2-52mm, pedunculated skin tag at the left introitus at the 4 o'clock position>>removed (see biopsy note)  Labs: 06/2022 TSH and CBC wnl  Assessment: patient stable  Plan:  1. Abnormal uterine bleeding (AUB) Follow up swab. She has never tried any hormones to help with her AUB. I told her that I wouldn't recommend a mirena, right now, b/c if her s/s are due to ovulation then the Mirena is not the best first option. I d/w her re: r/b/a of combined OCPs and recommend this, which she is amenable to Loestrin 24 sent. If this doesn't help, then recommend repeat u/s and consider an endometrial biopsy.  - Cervicovaginal ancillary only( [place]) - Surgical pathology( [place]/ [facility])  2. Skin tag of labia   Return in about 3 months (around 05/27/2023) for in person, with dr Vergie Living.  No future appointments.  [place], Montez Hageman  MD Attending Center for [employer] [facility])

## 2023-02-27 LAB — SURGICAL PATHOLOGY

## 2023-04-27 ENCOUNTER — Encounter: Payer: Self-pay | Admitting: Obstetrics and Gynecology

## 2023-05-30 ENCOUNTER — Other Ambulatory Visit (HOSPITAL_COMMUNITY)
Admission: RE | Admit: 2023-05-30 | Discharge: 2023-05-30 | Disposition: A | Discharge status: Home / Self Care | Payer: Managed Care, Other (non HMO) | Source: Ambulatory Visit | Attending: Obstetrics and Gynecology | Admitting: Obstetrics and Gynecology

## 2023-05-30 ENCOUNTER — Encounter: Payer: Self-pay | Admitting: Obstetrics and Gynecology

## 2023-05-30 ENCOUNTER — Ambulatory Visit (INDEPENDENT_AMBULATORY_CARE_PROVIDER_SITE_OTHER): Payer: Managed Care, Other (non HMO) | Admitting: Obstetrics and Gynecology

## 2023-05-30 VITALS — BP 110/60 | Ht 64.0 in | Wt 140.0 lb

## 2023-05-30 DIAGNOSIS — N939 Abnormal uterine and vaginal bleeding, unspecified: Secondary | ICD-10-CM | POA: Insufficient documentation

## 2023-05-30 DIAGNOSIS — N8110 Cystocele, unspecified: Secondary | ICD-10-CM

## 2023-05-30 DIAGNOSIS — N898 Other specified noninflammatory disorders of vagina: Secondary | ICD-10-CM | POA: Insufficient documentation

## 2023-05-30 DIAGNOSIS — Z113 Encounter for screening for infections with a predominantly sexual mode of transmission: Secondary | ICD-10-CM | POA: Diagnosis present

## 2023-05-30 DIAGNOSIS — N940 Mittelschmerz: Secondary | ICD-10-CM | POA: Diagnosis not present

## 2023-05-30 DIAGNOSIS — Z01419 Encounter for gynecological examination (general) (routine) without abnormal findings: Secondary | ICD-10-CM

## 2023-05-30 DIAGNOSIS — Z1231 Encounter for screening mammogram for malignant neoplasm of breast: Secondary | ICD-10-CM

## 2023-05-30 HISTORY — PX: ENDOMETRIAL BIOPSY: PRO73

## 2023-05-30 NOTE — Progress Notes (Signed)
Obstetrics and Gynecology Annual Patient Evaluation  Appointment Date: 05/30/2023  OBGYN Clinic: [facility]  Primary Care Provider: Kandyce Rud  Chief Complaint:  Chief Complaint  Patient presents with   Annual Exam    History of Present Illness: Lindsay Underwood is a 42 y.o. Caucasian G2P2002 ([name] last menstrual period was 05/23/2023.), seen for the above chief complaint. Her past medical history is significant for h/o chronic mid cycle AUB, h/o BTL with hysteroscopy/d&c/A-P repair in 2019, hypothyroidism  Patient last seen a few months and put on Loestrin to see if that can help with her mid cycle AUB. No real change and she feels it is causes her vaginal discharge with smell, as well as her 7lbs weight gain since last visit and some knee pain.   Patient feels prolapse maybe worse as she notes SUI with jumping type movements  Review of Systems: Pertinent items noted in HPI and remainder of comprehensive ROS otherwise negative.   Past Medical History:  Past Medical History:  Diagnosis Date   Anemia 2018   Cystocele, midline    Hemorrhoids    Hypothyroidism    Rectocele    Urinary, incontinence, stress female 09/05/2016    Past Surgical History:  Past Surgical History:  Procedure Laterality Date   ANTERIOR AND POSTERIOR VAGINAL REPAIR     07/2017. done with h/s, D&C, BTL   BILATERAL SALPINGECTOMY     laparoscopic   BIOPSY VULVA  02/24/2023   HYSTEROSCOPY WITH D & C      Past Obstetrical History:  OB History  Gravida Para Term Preterm AB Living  2 2 2     2   SAB IAB Ectopic Multiple Live Births          2    # Outcome Date GA Lbr Len/2nd Weight Sex Type Anes PTL Lv  2 Term 05/26/12 [redacted]w[redacted]d  6 lb 5 oz (2.863 kg) M Vag-Spont   LIV  1 Term 07/19/09 [redacted]w[redacted]d  6 lb 1 oz (2.75 kg) M Vag-Spont   LIV    Obstetric Comments  Menstrual age: 38    Age 1st Pregnancy: 27    Past Gynecological History: As per HPI. Periods: qmonth, regular,  not heavy or painful History of Pap Smear(s): Yes.   Last pap 2023, which was negative cytology and hpv She is currently using bilateral tubal ligation for contraception.   Social History:  Social History   Socioeconomic History   Marital status: Married    Spouse name: Not on file   Number of children: Not on file   Years of education: Not on file   Highest education level: Not on file  Occupational History   Not on file  Tobacco Use   Smoking status: Never   Smokeless tobacco: Never  Vaping Use   Vaping status: Never Used  Substance and Sexual Activity   Alcohol use: Yes    Comment: occasional   Drug use: No   Sexual activity: Yes    Birth control/protection: None  Other Topics Concern   Not on file  Social History Narrative   Not on file   Social Determinants of Health   Financial Resource Strain: Low Risk  (07/15/2022)   Received from [facility], [facility]   Overall Financial Resource Strain (CARDIA)    Difficulty of Paying Living Expenses: Not hard at all  Food Insecurity: No Food Insecurity (07/15/2022)   Received from [facility], [facility]   Hunger Vital Sign    Worried About Running Out of Food in the Last Year: Never true    Ran Out of Food in the Last Year: Never true  Transportation Needs: No Transportation Needs (07/15/2022)   Received from [facility], [facility]   [facility] - Transportation    In the past 12 months, has lack of transportation kept you from medical appointments or from getting medications?: No    Lack of Transportation (Non-Medical): No  Physical Activity: Not on file  Stress: Not on file  Social Connections: Not on file  Intimate Partner Violence: Not on file    Family History:  Family History  Problem Relation Age of Onset   Diabetes Father    Heart disease Father    Huntington's disease Mother    Huntington's  disease Brother    Sleep apnea Sister     Medications Lindsay Underwood had no medications administered during this visit. Current Outpatient Medications  Medication Sig Dispense Refill   cetirizine (ZYRTEC) 10 MG tablet Take 10 mg by mouth daily.     fluticasone (FLONASE) 50 MCG/ACT nasal spray Place 1 spray into both nostrils daily as needed for allergies or rhinitis.     levothyroxine (SYNTHROID) 50 MCG tablet Take 50 mcg by mouth.     montelukast (SINGULAIR) 10 MG tablet      Multiple Vitamin (MULTI-VITAMINS) TABS Take by mouth.     Norethindrone Acetate-Ethinyl Estrad-FE (LOESTRIN 24 FE) 1-20 MG-MCG(24) tablet Take 1 tablet by mouth daily. 90 tablet 1   sertraline (ZOLOFT) 50 MG tablet Take 100 mg by mouth daily.     No current facility-administered medications for this visit.    Allergies Patient has no known allergies.   Physical Exam:  BP 110/60   Ht 5\' 4"  (1.626 m)   Wt 140 lb (63.5 kg)   LMP 05/23/2023   BMI 24.03 kg/m  Body mass index is 24.03 kg/m. General appearance: Well nourished, well developed female in no acute distress.  Neck:  Supple, normal appearance, and no thyromegaly  Respiratory:  Normal respiratory effort Abdomen: positive bowel sounds and no masses, hernias; diffusely non tender to palpation, non distended Breasts: breasts appear normal, no suspicious masses, no skin or nipple changes or axillary nodes and normal palpation. Neuro/Psych:  Normal mood and affect.  Skin:  Warm and dry.  Lymphatic:  No inguinal lymphadenopathy.   Cervical exam performed in the presence of a chaperone Pelvic exam: is not limited by body habitus EGBUS: within normal limits Vagina: moderate amount of white, clumpy discharge in vault, no blood. On split speculum exam, anterior and posterior vault prolapse seems a little worse than last year with cystocele to -1 to -2 and rectocele to -2 to -1 Cervix: normal appearing cervix without tenderness, discharge or lesions. Uterus:   nonenlarged and non tender, mobile Adnexa:  normal adnexa and no mass, fullness, tenderness Rectovaginal: deferred 1.5cm external hemorrhoid, wnl, noted  See procedure note for embx   Laboratory: none  Radiology: none  Assessment: patient stable  Plan:  1. Well woman exam Patient desires mammogram screening  2. Abnormal uterine bleeding (AUB) Since no improvement on OCPs, I told her I recommend work up with embx (done today) and TVUS. After TVUS done, I told her that she can stop the OCPs. If work up negative, patient leaning towards expectant management and can repeat work up every few years instead of repeat hysteroscopy - US  PELVIS TRANSVAGINAL NON-OB (TV ONLY); Future  3. Vaginal discharge - Cervicovaginal ancillary only( [place])  4. Screen for STD (sexually transmitted disease) Declines bloodwork. - Cervicovaginal ancillary only( [place])  5. Prolapse Recommend pelvic floor PT. Patient to consider  RTC call patient after u/s results   Cornelia Copa MD Attending Center for [facility])

## 2023-05-30 NOTE — Progress Notes (Signed)
Patient presents for Annual.  LMP: 8/27 roughly  Last pap: Date: 05/17/22 Contraception: OCP Mammogram: 09/07/22 STD Screening: Declines Flu Vaccine : Declines  CC:  Wants to discuss birth control, and weight gain  Has a lot of vaginal discharge near period

## 2023-05-30 NOTE — Procedures (Signed)
Endometrial Biopsy Procedure Note  Pre-operative Diagnosis: mid cycle AUB  Post-operative Diagnosis: same  Procedure Details  Pelvic exam performed in the presence of a chaperone Urine pregnancy test was not done.  The risks (including infection, bleeding, pain, and uterine perforation) and benefits of the procedure were explained to the patient and Written informed consent was obtained.  The patient was placed in the dorsal lithotomy position.  Bimanual exam showed the uterus to be in the neutral position.  A Graves' speculum inserted in the vagina, and the cervix was visualized and a vaginal swab. The cervix was then prepped with povidone iodine, and a pipelle was inserted into the uterine cavity and sounded the uterus to a depth of 8.5cm.  A Minimal amount of tissue was collected after 2 passes. The sample was sent for pathologic examination.  Condition: Stable  Complications: None  Plan: The patient was advised to call for any fever or for prolonged or severe pain or bleeding. She was advised to use OTC analgesics as needed for mild to moderate pain. She was advised to avoid vaginal intercourse for 48 hours or until the bleeding has completely stopped.  Cornelia Copa MD Attending Center for [employer])

## 2023-05-31 ENCOUNTER — Other Ambulatory Visit: Payer: Self-pay | Admitting: Obstetrics and Gynecology

## 2023-05-31 DIAGNOSIS — Z1231 Encounter for screening mammogram for malignant neoplasm of breast: Secondary | ICD-10-CM

## 2023-06-01 LAB — CERVICOVAGINAL ANCILLARY ONLY
Bacterial Vaginitis (gardnerella): POSITIVE — AB
Candida Glabrata: NEGATIVE
Candida Vaginitis: POSITIVE — AB
Chlamydia: NEGATIVE
Comment: NEGATIVE
Comment: NEGATIVE
Comment: NEGATIVE
Comment: NEGATIVE
Comment: NEGATIVE
Comment: NORMAL
Neisseria Gonorrhea: NEGATIVE
Trichomonas: NEGATIVE

## 2023-06-01 LAB — SURGICAL PATHOLOGY

## 2023-06-02 ENCOUNTER — Other Ambulatory Visit: Payer: Self-pay | Admitting: *Deleted

## 2023-06-02 ENCOUNTER — Ambulatory Visit
Admission: RE | Admit: 2023-06-02 | Discharge: 2023-06-02 | Disposition: A | Discharge status: Home / Self Care | Payer: Managed Care, Other (non HMO) | Source: Ambulatory Visit | Attending: Obstetrics and Gynecology

## 2023-06-02 DIAGNOSIS — N939 Abnormal uterine and vaginal bleeding, unspecified: Secondary | ICD-10-CM | POA: Diagnosis present

## 2023-06-02 MED ORDER — FLUCONAZOLE 150 MG PO TABS
150.0000 mg | ORAL_TABLET | Freq: Once | ORAL | 3 refills | Status: AC
Start: 2023-06-02 — End: 2023-06-02

## 2023-06-02 MED ORDER — METRONIDAZOLE 500 MG PO TABS
500.0000 mg | ORAL_TABLET | Freq: Two times a day (BID) | ORAL | 0 refills | Status: AC
Start: 2023-06-02 — End: 2023-06-09

## 2023-06-07 ENCOUNTER — Ambulatory Visit
Admission: RE | Admit: 2023-06-07 | Discharge: 2023-06-07 | Disposition: A | Discharge status: Home / Self Care | Payer: Managed Care, Other (non HMO) | Source: Ambulatory Visit | Attending: Obstetrics and Gynecology | Admitting: Obstetrics and Gynecology

## 2023-06-07 DIAGNOSIS — Z1231 Encounter for screening mammogram for malignant neoplasm of breast: Secondary | ICD-10-CM | POA: Insufficient documentation

## 2023-06-12 ENCOUNTER — Encounter: Payer: Self-pay | Admitting: Obstetrics and Gynecology

## 2023-06-22 ENCOUNTER — Telehealth: Payer: Self-pay | Admitting: Obstetrics and Gynecology

## 2023-06-22 DIAGNOSIS — D251 Intramural leiomyoma of uterus: Secondary | ICD-10-CM

## 2023-06-22 NOTE — Telephone Encounter (Signed)
GYN Telephone Note Late entry for 06/20/23  Ultrasound shows 3-4cm IM fundal fibroid and all else normal. I reviewed the images and confirm; endometrial cavity appears normal. I told her that I don't have access to OSH u/s from 2019 but no fibroid seen then so have to assume this is new. I told her that based on the location of the fibroid I do not feel that it is causing her intermenstrual bleeding.  So I do not recommend removal based off of that symptom.  She states that she is having some cramping and pain and is wondering if it could be due to the fibroid.  I told her that the fibroid is not large and the overall size of her uterus is normal so I did not think so, but she is interested in options in dealing with it.  I discussed with her regarding sonata procedure as well as myomectomy.  I discussed with her regarding the risk and benefits of both procedures and that she might be a candidate for both, and because it appears to be just 1 fibroid that laparoscopic removal may be feasible.  I told her that I do not do laparoscopic removal and that if she is interested in seeing if she is a candidate for this then I would refer her to one of my partners, which she is interested in.  I also told her that given her age and her history of BTL that she may also want to consider hysterectomy.  I told her that if she does have a laparoscopic myomectomy that this is a major surgery (is not as major as a hysterectomy) but that she may want to consider a hysterectomy.  I also told her that if she is considering a hysterectomy and would like to hear if she is a candidate for it then I would recommend she see urogyn instead given her prolapse on exam.  For right now patient would just like to hear if she is a candidate for laparoscopic myomectomy so I will refer her to one of my partners for this.  All questions asked and answered  Cornelia Copa MD Attending Center for [facility])

## 2023-06-26 ENCOUNTER — Encounter: Payer: Self-pay | Admitting: Obstetrics and Gynecology

## 2023-07-12 ENCOUNTER — Ambulatory Visit (INDEPENDENT_AMBULATORY_CARE_PROVIDER_SITE_OTHER): Payer: Managed Care, Other (non HMO) | Admitting: Obstetrics and Gynecology

## 2023-07-12 ENCOUNTER — Encounter: Payer: Self-pay | Admitting: Obstetrics and Gynecology

## 2023-07-12 ENCOUNTER — Other Ambulatory Visit: Payer: Self-pay

## 2023-07-12 VITALS — BP 113/71 | HR 64 | Ht 64.0 in | Wt 140.3 lb

## 2023-07-12 DIAGNOSIS — N939 Abnormal uterine and vaginal bleeding, unspecified: Secondary | ICD-10-CM | POA: Diagnosis not present

## 2023-07-12 DIAGNOSIS — Z1330 Encounter for screening examination for mental health and behavioral disorders, unspecified: Secondary | ICD-10-CM | POA: Diagnosis not present

## 2023-07-12 DIAGNOSIS — D251 Intramural leiomyoma of uterus: Secondary | ICD-10-CM | POA: Diagnosis not present

## 2023-07-12 NOTE — Progress Notes (Signed)
GYNECOLOGY VISIT  Patient name: Lindsay Underwood MRN [id number]  Date of birth: Jul 08, 1981 Chief Complaint:   Fibroids  History:  Price Phillabaum is a 42 y.o. [phone] being seen today for fibroids.  Was on birth control - had less bleeding in between periods but less. Had EMB and that was negative. Had [place] that noted fibroids. Twice a year will have significant cramps, even without a menses. He recommend a hysterectomy. Has only had the pain w/o a cycle just once and that was the only time. Pain is cramping sensation. Laying down and heating pad and APAP/Ibuprofen provided imporvement.  Half way through the period; has cramping and bleeding requires a pad but not quite a cup, all the typical symptoms of a menses.  Has already had a prior posterior and anterior repair and aware she would need another surgery. She is not ready for a hysterectomy just yet. She was previously told she would likely need another prolapse surgery.    Past Medical History:  Diagnosis Date   Anemia 2018   Cystocele, midline    Hemorrhoids    Hypothyroidism    Rectocele    Urinary, incontinence, stress female 09/05/2016    Past Surgical History:  Procedure Laterality Date   ANTERIOR AND POSTERIOR VAGINAL REPAIR     07/2017. done with h/s, D&C, BTL   BILATERAL SALPINGECTOMY     laparoscopic   BIOPSY VULVA  02/24/2023   ENDOMETRIAL BIOPSY  05/30/2023   HYSTEROSCOPY WITH D & C      The following portions of the patient's history were reviewed and updated as appropriate: allergies, current medications, past family history, past medical history, past social history, past surgical history and problem list.   Health Maintenance:   Last pap     Component Value Date/Time   DIAGPAP  05/17/2022 1518    - Negative for Intraepithelial Lesions or Malignancy (NILM)   DIAGPAP - Benign reactive/reparative changes 05/17/2022 1518   DIAGPAP  03/14/2018 0000    NEGATIVE FOR INTRAEPITHELIAL LESIONS OR MALIGNANCY.   HPVHIGH  Negative 05/17/2022 1518   ADEQPAP  05/17/2022 1518    Satisfactory for evaluation; transformation zone component PRESENT.   ADEQPAP  03/14/2018 0000    Satisfactory for evaluation  endocervical/transformation zone component PRESENT.    High Risk HPV: Positive  Adequacy:  Satisfactory for evaluation, transformation zone component PRESENT  Diagnosis:  Atypical squamous cells of undetermined significance (ASC-US)  Last mammogram: 05/2023 BIRADS 1   Review of Systems:  Pertinent items are noted in HPI. Comprehensive review of systems was otherwise negative.   Objective:  Physical Exam BP 113/71   Pulse 64   Ht 5\' 4"  (1.626 m)   Wt 140 lb 4.8 oz (63.6 kg)   BMI 24.08 kg/m    Physical Exam Vitals and nursing note reviewed. Exam conducted with a chaperone present.  Constitutional:      Appearance: Normal appearance.  HENT:     Head: Normocephalic and atraumatic.  Pulmonary:     Effort: Pulmonary effort is normal.     Breath sounds: Normal breath sounds.  Genitourinary:    General: Normal vulva.     Exam position: Lithotomy position.     Vagina: Normal.     Cervix: Normal.  Skin:    General: Skin is warm and dry.  Neurological:     General: No focal deficit present.     Mental Status: She is alert.  Psychiatric:  Mood and Affect: Mood normal.        Behavior: Behavior normal.        Thought Content: Thought content normal.        Judgment: Judgment normal.       Assessment & Plan:   1. Intramural leiomyoma of uterus Discussed different methods for managing fibroids. Discussed that size and position, not as likely to be contributing to pain and that myomectomy may not confer the benefit that she is looking for. Will proceed with sonata instead to help shrink fibroid  2. Abnormal uterine bleeding (AUB) Discussed various surgical management options for AUB. States she is not ready for hysterectomy but open to ablation. Has benign EMB on file.  Patient  desires surgical management with sonata and endometrial ablation.  The risks of surgery were discussed in detail with the patient including but not limited to: bleeding which may require transfusion or reoperation; infection which may require prolonged hospitalization or re-hospitalization and antibiotic therapy; injury to bowel, bladder, ureters and major vessels or other surrounding organs which may lead to other procedures; formation of adhesions; need for additional procedures including laparotomy or subsequent procedures secondary to intraoperative injury or abnormal pathology; thromboembolic phenomenon; incisional problems and other postoperative or anesthesia complications.  Patient was told that the likelihood that her condition and symptoms will be treated effectively with this surgical management was moderate; the postoperative expectations were also discussed in detail. The patient also understands the alternative treatment options which were discussed in full. All questions were answered.  She was told that she will be contacted by our surgical scheduler regarding the time and date of her surgery; routine preoperative instructions will be given to her by the preoperative nursing team.    Printed patient education handouts about the procedure were given to the patient to review at home.   Routine preventative health maintenance measures emphasized.  Lorriane Shire, MD Minimally Invasive Gynecologic Surgery Center for [facility], [facility]

## 2023-07-20 ENCOUNTER — Encounter: Payer: Self-pay | Admitting: Obstetrics and Gynecology

## 2023-07-26 ENCOUNTER — Ambulatory Visit
Admission: EM | Admit: 2023-07-26 | Discharge: 2023-07-26 | Disposition: A | Discharge status: Home / Self Care | Payer: Managed Care, Other (non HMO)

## 2023-07-26 DIAGNOSIS — R0602 Shortness of breath: Secondary | ICD-10-CM

## 2023-07-26 DIAGNOSIS — J019 Acute sinusitis, unspecified: Secondary | ICD-10-CM

## 2023-07-26 DIAGNOSIS — R051 Acute cough: Secondary | ICD-10-CM | POA: Diagnosis not present

## 2023-07-26 MED ORDER — PROMETHAZINE-DM 6.25-15 MG/5ML PO SYRP: 5.0000 mL | ORAL_SOLUTION | Freq: Four times a day (QID) | ORAL | 0 refills | Status: DC | PRN

## 2023-07-26 MED ORDER — PROMETHAZINE-DM 6.25-15 MG/5ML PO SYRP: 5.0000 mL | ORAL_SOLUTION | Freq: Four times a day (QID) | ORAL | 0 refills | Status: AC | PRN

## 2023-07-26 MED ORDER — AMOXICILLIN-POT CLAVULANATE 875-125 MG PO TABS: 1.0000 | ORAL_TABLET | Freq: Two times a day (BID) | ORAL | 0 refills | Status: AC

## 2023-07-26 MED ORDER — AMOXICILLIN-POT CLAVULANATE 875-125 MG PO TABS: 1.0000 | ORAL_TABLET | Freq: Two times a day (BID) | ORAL | 0 refills | Status: DC

## 2023-07-26 NOTE — Discharge Instructions (Signed)
-  I think you are developing a sinus infection.  I sent antibiotic to pharmacy and cough medicine. - You should be feeling better over the next week but if you develop a fever have increased shortness of breath please return for reevaluation and consideration of an x-ray.

## 2023-07-26 NOTE — ED Triage Notes (Signed)
Pt c/o cough & chest congestion x7 days. Denies any fevers. Has tried tylenol,mucinex & IBU w/o relief.

## 2023-07-26 NOTE — ED Provider Notes (Signed)
[facility]    CSN: [id number] Arrival date & time: 07/26/23  1856      History   Chief Complaint Chief Complaint  Patient presents with   Cough    HPI Lindsay Underwood is a 42 y.o. female.   HPI  Past Medical History:  Diagnosis Date   Anemia 2018   Cystocele, midline    Hemorrhoids    Hypothyroidism    Rectocele    Urinary, incontinence, stress female 09/05/2016    Patient Active Problem List   Diagnosis Date Noted   Vaginal wall prolapse 05/17/2022   [place]mer[place] 05/17/2021   Menorrhagia with regular cycle 05/17/2021   Grade III hemorrhoids 05/14/2019   Hypothyroidism 06/11/2018   Intermenstrual bleeding 09/05/2016    Past Surgical History:  Procedure Laterality Date   ANTERIOR AND POSTERIOR VAGINAL REPAIR     07/2017. done with h/s, D&C, BTL   BILATERAL SALPINGECTOMY     laparoscopic   BIOPSY VULVA  02/24/2023   ENDOMETRIAL BIOPSY  05/30/2023   HYSTEROSCOPY WITH D & C      OB History     Gravida  2   Para  2   Term  2   Preterm      AB      Living  2      SAB      IAB      Ectopic      Multiple      Live Births  2        Obstetric Comments  Menstrual age: 42  Age 1st Pregnancy: 56           Home Medications    Prior to Admission medications   Medication Sig Start Date End Date Taking? Authorizing Provider  cetirizine (ZYRTEC) 10 MG tablet Take 10 mg by mouth daily.    [provider]  fluticasone (FLONASE) 50 MCG/ACT nasal spray Place 1 spray into both nostrils daily as needed for allergies or rhinitis.    [provider]  levothyroxine (SYNTHROID) 50 MCG tablet Take 50 mcg by mouth.    [provider]  montelukast (SINGULAIR) 10 MG tablet  10/04/18   [provider]  Multiple Vitamin (MULTI-VITAMINS) TABS Take by mouth.    [provider]  sertraline (ZOLOFT) 50 MG tablet Take 100 mg by mouth daily.    [provider]    Family History Family History   Problem Relation Age of Onset   Diabetes Father    Heart disease Father    Huntington's disease Mother    Huntington's disease Brother    Sleep apnea Sister     Social History Social History   Tobacco Use   Smoking status: Never   Smokeless tobacco: Never  Vaping Use   Vaping status: Never Used  Substance Use Topics   Alcohol use: Yes    Comment: occasional   Drug use: No     Allergies   Patient has no known allergies.   Review of Systems Review of Systems  Constitutional:  Negative for chills, diaphoresis, fatigue and fever.  HENT:  Positive for congestion. Negative for ear pain, rhinorrhea, sinus pressure, sinus pain and sore throat.   Respiratory:  Positive for cough. Negative for shortness of breath.   Gastrointestinal:  Negative for abdominal pain, nausea and vomiting.  Musculoskeletal:  Negative for arthralgias and myalgias.  Skin:  Negative for rash.  Neurological:  Negative for weakness and headaches.  Hematological:  Negative for  adenopathy.     Physical Exam Triage Vital Signs ED Triage Vitals  Encounter Vitals Group     BP      Systolic BP Percentile      Diastolic BP Percentile      Pulse      Resp      Temp      Temp src      SpO2      Weight      Height      Head Circumference      Peak Flow      Pain Score      Pain Loc      Pain Education      Exclude from Growth Chart    No data found.  Updated Vital Signs There were no vitals taken for this visit.  Physical Exam Vitals and nursing note reviewed.  Constitutional:      General: She is not in acute distress.    Appearance: Normal appearance. She is not ill-appearing or toxic-appearing.  HENT:     Head: Normocephalic and atraumatic.     Nose: Nose normal.     Mouth/Throat:     Mouth: Mucous membranes are moist.     Pharynx: Oropharynx is clear.  Eyes:     General: No scleral icterus.       Right eye: No discharge.        Left eye: No discharge.     Conjunctiva/sclera:  Conjunctivae normal.  Cardiovascular:     Rate and Rhythm: Normal rate and regular rhythm.     Heart sounds: Normal heart sounds.  Pulmonary:     Effort: Pulmonary effort is normal. No respiratory distress.     Breath sounds: Normal breath sounds.  Musculoskeletal:     Cervical back: Neck supple.  Skin:    General: Skin is dry.  Neurological:     General: No focal deficit present.     Mental Status: She is alert. Mental status is at baseline.     Motor: No weakness.     Gait: Gait normal.  Psychiatric:        Mood and Affect: Mood normal.        Behavior: Behavior normal.        Thought Content: Thought content normal.      UC Treatments / Results  Labs (all labs ordered are listed, but only abnormal results are displayed) Labs Reviewed - No data to display  EKG   Radiology No results found.  Procedures Procedures (including critical care time)  Medications Ordered in UC Medications - No data to display  Initial Impression / Assessment and Plan / UC Course  I have reviewed the triage vital signs and the nursing notes.  Pertinent labs & imaging results that were available during my care of the patient were reviewed by me and considered in my medical decision making (see chart for details).     *** Final Clinical Impressions(s) / UC Diagnoses   Final diagnoses:  None   Discharge Instructions   None    ED Prescriptions   None    PDMP not reviewed this encounter.

## 2023-08-03 ENCOUNTER — Encounter: Payer: Self-pay | Admitting: Obstetrics and Gynecology

## 2023-08-06 ENCOUNTER — Other Ambulatory Visit: Payer: Self-pay | Admitting: Obstetrics and Gynecology

## 2023-08-10 ENCOUNTER — Telehealth: Payer: Self-pay

## 2023-08-10 NOTE — Telephone Encounter (Signed)
Patient would like to proceed with surgery w/ Dr. Briscoe Deutscher. She understands the Sonata procedure has to be scheduled at a later time.Patient was scheduled for 09/11/23 @[facility] at 1:45 pm. Surgery details will be sent to patients Mychart acct.

## 2023-08-15 ENCOUNTER — Ambulatory Visit: Payer: Managed Care, Other (non HMO)

## 2023-08-15 ENCOUNTER — Other Ambulatory Visit (HOSPITAL_COMMUNITY)
Admission: RE | Admit: 2023-08-15 | Discharge: 2023-08-15 | Disposition: A | Discharge status: Home / Self Care | Payer: Managed Care, Other (non HMO) | Source: Ambulatory Visit | Attending: Obstetrics & Gynecology | Admitting: Obstetrics & Gynecology

## 2023-08-15 DIAGNOSIS — N898 Other specified noninflammatory disorders of vagina: Secondary | ICD-10-CM

## 2023-08-15 MED ORDER — FLUCONAZOLE 150 MG PO TABS
150.0000 mg | ORAL_TABLET | Freq: Once | ORAL | 1 refills | Status: AC
Start: 2023-08-15 — End: 2023-08-15

## 2023-08-15 NOTE — Progress Notes (Signed)
SUBJECTIVE:  42 y.o. female complains of white vaginal discharge for several day(s)  Denies abnormal vaginal bleeding or significant pelvic pain or fever.Denies history of known exposure to STD.  No LMP recorded. (Menstrual status: Irregular Periods).  OBJECTIVE:  She appears alert, well appearing, in no apparent distress   ASSESSMENT:  Vaginal Discharge  Vaginal Odor   PLAN:  BVAG, CVAG probe  sent to lab. Treatment: Pt requesting Diflucan sent into pharmacy today as she gets recurrent yeast infections and is certain that is what she has going on  ROV prn if symptoms persist or worsen.

## 2023-08-15 NOTE — Addendum Note (Signed)
Addended by: Jyl Heinz on: 08/15/2023 01:12 PM   Modules accepted: Orders

## 2023-08-16 LAB — CERVICOVAGINAL ANCILLARY ONLY
Bacterial Vaginitis (gardnerella): NEGATIVE
Candida Glabrata: NEGATIVE
Candida Vaginitis: POSITIVE — AB
Comment: NEGATIVE
Comment: NEGATIVE
Comment: NEGATIVE

## 2023-08-29 ENCOUNTER — Encounter: Payer: Self-pay | Admitting: Obstetrics and Gynecology

## 2023-09-05 DIAGNOSIS — Z0289 Encounter for other administrative examinations: Secondary | ICD-10-CM

## 2023-09-07 ENCOUNTER — Encounter (HOSPITAL_BASED_OUTPATIENT_CLINIC_OR_DEPARTMENT_OTHER): Payer: Self-pay | Admitting: Obstetrics and Gynecology

## 2023-09-07 NOTE — Progress Notes (Signed)
Spoke w/ via phone for pre-op interview---Lindsay Underwood needs dos---- UPT per anesthesia        Underwood results------ COVID test -----patient states asymptomatic no test needed Arrive at -------0630 NPO after MN NO Solid Food.   Med rec completed Medications to take morning of surgery ----- Synthroid and Wellbutrin Diabetic medication ----- Patient instructed no nail polish to be worn day of surgery Patient instructed to bring photo id and insurance card day of surgery Patient aware to have Driver (ride ) / caregiver    for 24 hours after surgery - step mom Lindsay Underwood Patient Special Instructions ----- Pre-Op special Instructions ----- Patient verbalized understanding of instructions that were given at this phone interview. Patient denies chest pain, sob, fever, cough at the interview.

## 2023-09-12 NOTE — Anesthesia Preprocedure Evaluation (Signed)
Anesthesia Evaluation  Patient identified by MRN, date of birth, ID band Patient awake    Reviewed: Allergy & Precautions, NPO status , [name] Chart, lab work & pertinent test results  History of Anesthesia Complications Negative for: history of anesthetic complications  Airway Mallampati: I  TM Distance: >3 FB Neck ROM: Full    Dental no notable dental hx.    Pulmonary neg pulmonary ROS   Pulmonary exam normal        Cardiovascular negative cardio ROS Normal cardiovascular exam     Neuro/Psych   Anxiety Depression       GI/Hepatic negative GI ROS, Neg liver ROS,,,  Endo/Other  Hypothyroidism    Renal/GU negative Renal ROS     Musculoskeletal negative musculoskeletal ROS (+)    Abdominal   Peds  Hematology negative hematology ROS (+)   Anesthesia Other Findings Day of surgery medications reviewed with patient.  Reproductive/Obstetrics                              Anesthesia Physical Anesthesia Plan  ASA: 2  Anesthesia Plan: General   Post-op Pain Management: Tylenol PO (pre-op)*   Induction: Intravenous  PONV Risk Score and Plan: 3 and Treatment may vary due to age or medical condition, Ondansetron, Dexamethasone and Midazolam  Airway Management Planned: LMA  Additional Equipment: None  Intra-op Plan:   Post-operative Plan: Extubation in OR  Informed Consent: I have reviewed the patients History and Physical, chart, labs and discussed the procedure including the risks, benefits and alternatives for the proposed anesthesia with the patient or authorized representative who has indicated his/her understanding and acceptance.     Dental advisory given  Plan Discussed with: CRNA  Anesthesia Plan Comments:         Anesthesia Quick Evaluation

## 2023-09-13 ENCOUNTER — Other Ambulatory Visit: Payer: Self-pay

## 2023-09-13 ENCOUNTER — Ambulatory Visit (HOSPITAL_BASED_OUTPATIENT_CLINIC_OR_DEPARTMENT_OTHER): Payer: Managed Care, Other (non HMO) | Admitting: Anesthesiology

## 2023-09-13 ENCOUNTER — Encounter (HOSPITAL_BASED_OUTPATIENT_CLINIC_OR_DEPARTMENT_OTHER): Payer: Self-pay | Admitting: Obstetrics and Gynecology

## 2023-09-13 ENCOUNTER — Encounter (HOSPITAL_BASED_OUTPATIENT_CLINIC_OR_DEPARTMENT_OTHER)
Admission: RE | Disposition: A | Discharge status: Home / Self Care | Payer: Self-pay | Source: Home / Self Care | Attending: Obstetrics and Gynecology

## 2023-09-13 ENCOUNTER — Ambulatory Visit (HOSPITAL_BASED_OUTPATIENT_CLINIC_OR_DEPARTMENT_OTHER): Payer: Self-pay | Admitting: Anesthesiology

## 2023-09-13 ENCOUNTER — Ambulatory Visit (HOSPITAL_COMMUNITY)
Admission: RE | Admit: 2023-09-13 | Discharge: 2023-09-13 | Disposition: A | Discharge status: Home / Self Care | Payer: Managed Care, Other (non HMO) | Attending: Obstetrics and Gynecology | Admitting: Obstetrics and Gynecology

## 2023-09-13 DIAGNOSIS — N840 Polyp of corpus uteri: Secondary | ICD-10-CM | POA: Diagnosis not present

## 2023-09-13 DIAGNOSIS — N938 Other specified abnormal uterine and vaginal bleeding: Secondary | ICD-10-CM | POA: Diagnosis not present

## 2023-09-13 DIAGNOSIS — E039 Hypothyroidism, unspecified: Secondary | ICD-10-CM | POA: Insufficient documentation

## 2023-09-13 DIAGNOSIS — N939 Abnormal uterine and vaginal bleeding, unspecified: Secondary | ICD-10-CM | POA: Diagnosis present

## 2023-09-13 DIAGNOSIS — D259 Leiomyoma of uterus, unspecified: Secondary | ICD-10-CM | POA: Diagnosis not present

## 2023-09-13 DIAGNOSIS — Z01818 Encounter for other preprocedural examination: Secondary | ICD-10-CM

## 2023-09-13 HISTORY — DX: Depression, unspecified: F32.A

## 2023-09-13 HISTORY — PX: DILITATION & CURRETTAGE/HYSTROSCOPY WITH NOVASURE ABLATION: SHX5568

## 2023-09-13 HISTORY — DX: Anxiety disorder, unspecified: F41.9

## 2023-09-13 LAB — POCT PREGNANCY, URINE: Preg Test, Ur: NEGATIVE

## 2023-09-13 SURGERY — DILATATION & CURETTAGE/HYSTEROSCOPY WITH NOVASURE ABLATION
Anesthesia: General | Site: Uterus

## 2023-09-13 MED ORDER — LACTATED RINGERS IV SOLN
INTRAVENOUS | Status: DC
Start: 2023-09-13 — End: 2023-09-13

## 2023-09-13 MED ORDER — LACTATED RINGERS IV SOLN: INTRAVENOUS | Status: DC

## 2023-09-13 MED ORDER — MIDAZOLAM HCL 5 MG/5ML IJ SOLN
INTRAMUSCULAR | Status: DC | PRN
  Administered 2023-09-13: 2 mg via INTRAVENOUS

## 2023-09-13 MED ORDER — SODIUM CHLORIDE 0.9 % IR SOLN
Status: DC | PRN
  Administered 2023-09-13: 3000 mL

## 2023-09-13 MED ORDER — BUPIVACAINE HCL (PF) 0.5 % IJ SOLN
INTRAMUSCULAR | Status: DC | PRN
  Administered 2023-09-13: 20 mL

## 2023-09-13 MED ORDER — ONDANSETRON HCL 4 MG/2ML IJ SOLN
INTRAMUSCULAR | Status: AC
  Filled 2023-09-13: qty 2

## 2023-09-13 MED ORDER — PHENYLEPHRINE 80 MCG/ML (10ML) SYRINGE FOR IV PUSH (FOR BLOOD PRESSURE SUPPORT)
PREFILLED_SYRINGE | INTRAVENOUS | Status: DC | PRN
  Administered 2023-09-13: 160 ug via INTRAVENOUS
  Administered 2023-09-13 (×2): 80 ug via INTRAVENOUS

## 2023-09-13 MED ORDER — ONDANSETRON HCL 4 MG/2ML IJ SOLN
INTRAMUSCULAR | Status: DC | PRN
  Administered 2023-09-13: 4 mg via INTRAVENOUS

## 2023-09-13 MED ORDER — IBUPROFEN 800 MG PO TABS: 800.0000 mg | ORAL_TABLET | Freq: Three times a day (TID) | ORAL | 1 refills | Status: AC | PRN

## 2023-09-13 MED ORDER — SCOPOLAMINE 1 MG/3DAYS TD PT72
MEDICATED_PATCH | TRANSDERMAL | Status: AC
  Filled 2023-09-13: qty 1

## 2023-09-13 MED ORDER — MIDAZOLAM HCL 2 MG/2ML IJ SOLN
INTRAMUSCULAR | Status: AC
Start: 2023-09-13 — End: ?
  Filled 2023-09-13: qty 2

## 2023-09-13 MED ORDER — OXYCODONE HCL 5 MG PO TABS: 5.0000 mg | ORAL_TABLET | Freq: Once | ORAL | Status: DC | PRN

## 2023-09-13 MED ORDER — LIDOCAINE HCL (PF) 2 % IJ SOLN
INTRAMUSCULAR | Status: AC
  Filled 2023-09-13: qty 5

## 2023-09-13 MED ORDER — FENTANYL CITRATE (PF) 100 MCG/2ML IJ SOLN
INTRAMUSCULAR | Status: AC
  Filled 2023-09-13: qty 2

## 2023-09-13 MED ORDER — ACETAMINOPHEN 500 MG PO TABS
ORAL_TABLET | ORAL | Status: AC
  Filled 2023-09-13: qty 2

## 2023-09-13 MED ORDER — FENTANYL CITRATE (PF) 100 MCG/2ML IJ SOLN
INTRAMUSCULAR | Status: DC | PRN
  Administered 2023-09-13: 50 ug via INTRAVENOUS

## 2023-09-13 MED ORDER — ARTIFICIAL TEARS OPHTHALMIC OINT
TOPICAL_OINTMENT | OPHTHALMIC | Status: AC
  Filled 2023-09-13: qty 3.5

## 2023-09-13 MED ORDER — SCOPOLAMINE 1 MG/3DAYS TD PT72
1.0000 | MEDICATED_PATCH | Freq: Once | TRANSDERMAL | Status: DC
  Administered 2023-09-13: 1.5 mg via TRANSDERMAL

## 2023-09-13 MED ORDER — DEXAMETHASONE SODIUM PHOSPHATE 10 MG/ML IJ SOLN
INTRAMUSCULAR | Status: AC
  Filled 2023-09-13: qty 1

## 2023-09-13 MED ORDER — PROPOFOL 10 MG/ML IV BOLUS
INTRAVENOUS | Status: AC
  Filled 2023-09-13: qty 20

## 2023-09-13 MED ORDER — ACETAMINOPHEN 500 MG PO TABS
1000.0000 mg | ORAL_TABLET | ORAL | Status: AC
  Administered 2023-09-13: 1000 mg via ORAL

## 2023-09-13 MED ORDER — DEXAMETHASONE SODIUM PHOSPHATE 10 MG/ML IJ SOLN
INTRAMUSCULAR | Status: DC | PRN
  Administered 2023-09-13: 10 mg via INTRAVENOUS

## 2023-09-13 MED ORDER — DROPERIDOL 2.5 MG/ML IJ SOLN: 0.6250 mg | Freq: Once | INTRAMUSCULAR | Status: DC | PRN

## 2023-09-13 MED ORDER — GLYCOPYRROLATE PF 0.2 MG/ML IJ SOSY
PREFILLED_SYRINGE | INTRAMUSCULAR | Status: AC
  Filled 2023-09-13: qty 1

## 2023-09-13 MED ORDER — LIDOCAINE 2% (20 MG/ML) 5 ML SYRINGE
INTRAMUSCULAR | Status: DC | PRN
  Administered 2023-09-13: 60 mg via INTRAVENOUS

## 2023-09-13 MED ORDER — KETOROLAC TROMETHAMINE 30 MG/ML IJ SOLN
INTRAMUSCULAR | Status: AC
  Filled 2023-09-13: qty 1

## 2023-09-13 MED ORDER — PROPOFOL 10 MG/ML IV BOLUS
INTRAVENOUS | Status: DC | PRN
  Administered 2023-09-13: 150 mg via INTRAVENOUS
  Administered 2023-09-13 (×2): 50 mg via INTRAVENOUS

## 2023-09-13 MED ORDER — GLYCOPYRROLATE PF 0.2 MG/ML IJ SOSY
PREFILLED_SYRINGE | INTRAMUSCULAR | Status: DC | PRN
  Administered 2023-09-13: .2 mg via INTRAVENOUS

## 2023-09-13 MED ORDER — PHENYLEPHRINE 80 MCG/ML (10ML) SYRINGE FOR IV PUSH (FOR BLOOD PRESSURE SUPPORT)
PREFILLED_SYRINGE | INTRAVENOUS | Status: AC
  Filled 2023-09-13: qty 10

## 2023-09-13 MED ORDER — ACETAMINOPHEN 500 MG PO TABS: 1000.0000 mg | ORAL_TABLET | Freq: Four times a day (QID) | ORAL | 0 refills | Status: AC | PRN

## 2023-09-13 MED ORDER — FENTANYL CITRATE (PF) 100 MCG/2ML IJ SOLN: 25.0000 ug | INTRAMUSCULAR | Status: DC | PRN

## 2023-09-13 MED ORDER — KETOROLAC TROMETHAMINE 30 MG/ML IJ SOLN
INTRAMUSCULAR | Status: DC | PRN
  Administered 2023-09-13: 30 mg via INTRAVENOUS

## 2023-09-13 MED ORDER — SODIUM CHLORIDE 0.9 % IV SOLN
INTRAVENOUS | Status: DC
  Administered 2023-09-13: 500 mL via INTRAVENOUS

## 2023-09-13 MED ORDER — OXYCODONE HCL 5 MG/5ML PO SOLN: 5.0000 mg | Freq: Once | ORAL | Status: DC | PRN

## 2023-09-13 SURGICAL SUPPLY — 21 items
ABLATOR SURESOUND NOVASURE (ABLATOR) IMPLANT
CATH ROBINSON RED A/P 16FR (CATHETERS) IMPLANT
DEVICE MYOSURE LITE (MISCELLANEOUS) IMPLANT
DEVICE MYOSURE REACH (MISCELLANEOUS) IMPLANT
DILATOR CANAL MILEX (MISCELLANEOUS) IMPLANT
GAUZE 4X4 16PLY ~~LOC~~+RFID DBL (SPONGE) IMPLANT
GLOVE BIO SURGEON STRL SZ7 (GLOVE) ×1 IMPLANT
GLOVE BIOGEL PI IND STRL 7.0 (GLOVE) ×1 IMPLANT
GLOVE SURG SS PI 6.5 STRL IVOR (GLOVE) IMPLANT
GOWN STRL REUS W/ TWL XL LVL3 (GOWN DISPOSABLE) ×1 IMPLANT
KIT PROCEDURE FLUENT (KITS) ×1 IMPLANT
KIT TURNOVER CYSTO (KITS) ×1 IMPLANT
LOOP CUTTING BIPOLAR 21FR (ELECTRODE) IMPLANT
MYOSURE XL FIBROID (MISCELLANEOUS)
PACK VAGINAL MINOR WOMEN LF (CUSTOM PROCEDURE TRAY) ×1 IMPLANT
PAD OB MATERNITY 4.3X12.25 (PERSONAL CARE ITEMS) ×1 IMPLANT
SEAL CERVICAL OMNI LOK (ABLATOR) IMPLANT
SEAL ROD LENS SCOPE MYOSURE (ABLATOR) ×1 IMPLANT
SLEEVE SCD COMPRESS KNEE MED (STOCKING) ×1 IMPLANT
SYSTEM TISS REMOVAL MYOSURE XL (MISCELLANEOUS) IMPLANT
TOWEL OR 17X24 6PK STRL BLUE (TOWEL DISPOSABLE) ×1 IMPLANT

## 2023-09-13 NOTE — Transfer of Care (Signed)
Immediate Anesthesia Transfer of Care Note  Patient: Lindsay Underwood  Procedure(s) Performed: DILATATION & CURETTAGE/HYSTEROSCOPY WITH MYOSURE AND NOVASURE ABLATION (Uterus)  Patient Location: PACU  Anesthesia Type:General  Level of Consciousness: drowsy, patient cooperative, and responds to stimulation  Airway & Oxygen Therapy: Patient Spontanous Breathing and Patient connected to nasal cannula oxygen  Post-op Assessment: Report given to RN and Post -op Vital signs reviewed and stable  Post vital signs: Reviewed and stable  Last Vitals:  Vitals Value Taken Time  BP 101/69 09/13/23 0915  Temp    Pulse 89 09/13/23 0916  Resp 16 09/13/23 0916  SpO2 100 % 09/13/23 0916  Vitals shown include unfiled device data.  Last Pain:  Vitals:   09/13/23 0655  TempSrc: Oral  PainSc: 0-No pain      Patients Stated Pain Goal: 8 (09/13/23 2956)  Complications: No notable events documented.

## 2023-09-13 NOTE — Op Note (Signed)
[facility] PROCEDURE DATE: 09/13/2023  PREOPERATIVE DIAGNOSIS: abnormal uterine bleeding  POSTOPERATIVE DIAGNOSIS: abnormal uterine bleeding PROCEDURE:   operative hysteroscopy, novasure ablation  SURGEON: Lorriane Shire, MD ASSISTANT:  none  INDICATIONS: 42 y.o. [id number] with AUB.  Risks of surgery were discussed with the patient including but not limited to: bleeding which may require transfusion; infection which may require antibiotics; injury to surrounding organs; need for additional procedures including laparotomy;  and other postoperative/anesthesia complications. Written informed consent was obtained.    FINDINGS:  Normal external genitalia, 8 wk size mobile uterus with Normal contours.  Hysteroscopically: fluffy endometrium and polypoid tissue on posterior wall, bilateral tubal ostia visualized   ANESTHESIA: General, paracervial block INTRAVENOUS FLUIDS:  500 ml of LR ESTIMATED BLOOD LOSS:  10 ml SPECIMENS: endometrial curettings and (possible) polyp COMPLICATIONS:  None immediate.  FLUID DEFICIT: 100 ml of normal saline  PROCEDURE DETAILS:  The patient was taken to the operating room where general anesthesia was administered and was found to be adequate.  After an adequate timeout was performed, she was placed in the dorsal lithotomy position and examined; then prepped and draped in the sterile manner.   . A speculum was then placed in the patient's vagina and a single tooth tenaculum was applied to the anterior lip of the cervix.  A paracervical block using 0.5% Marcaine was administered.  The cervix was dilated manually with Hegar dilators to accommodate the diagnostic hysteroscope.  Once the cervix was dilated, the hysteroscope was inserted under direct visualization.   The findings above were noted. The uterine cavity was carefully examined, both ostia were recognized. The myosure lite was used to resect the endometrium and polypoid tissue.  The included sound was used to  measure the cavity length, which were 5.5 cm and 4.5cm, respectrively.  The NovaSure device was inserted, and a cavity width of 4.5 cm was determined. Using a power of 139 watts, for 1:28 sec, the endometrial ablation was performed. The hysteroscope was then re-introduced into the uterine cavity, confirming 99% ablation of the endometrium. The tenaculum was removed from the anterior lip of the cervix, and the vaginal speculum was removed after noting good hemostasis. A curette was used to remove loose tissue from the endometrium. The patient tolerated the procedure well and was taken to the recovery area awake, extubated and in stable condition.  The patient will be discharged to home as per PACU criteria.  Routine postoperative instructions given.  She was prescribed Percocet, Ibuprofen and Colace.  She will follow up in the clinic on 4 for postoperative evaluation.    Lorriane Shire, MD Minimally Invasive Gynecologic Surgery  Obstetrics and Gynecology, [facility] for [facility], [facility] 09/13/2023

## 2023-09-13 NOTE — Brief Op Note (Signed)
09/13/2023  9:02 AM  PATIENT:  Lindsay Underwood  42 y.o. female  PRE-OPERATIVE DIAGNOSIS:  Abnormal uterine bleeding fibroids  POST-OPERATIVE DIAGNOSIS:  Abnormal uterine bleedingfibroids  PROCEDURE:  Procedure(s): DILATATION & CURETTAGE/HYSTEROSCOPY WITH MYOSURE AND NOVASURE ABLATION (N/A)  SURGEON:  Surgeons and Role:    Lorriane Shire, MD - Primary  PHYSICIAN ASSISTANT: n/a  ASSISTANTS: none   ANESTHESIA:   general and paracervical block  EBL:  10 mL   BLOOD ADMINISTERED:none  DRAINS: none   LOCAL MEDICATIONS USED:  BUPIVICAINE   SPECIMEN:  Source of Specimen:  endometrial curettings and polyp  DISPOSITION OF SPECIMEN:  PATHOLOGY  COUNTS:  YES  TOURNIQUET:  * No tourniquets in log *  DICTATION: .Note written in EPIC  PLAN OF CARE: Discharge to home after PACU  PATIENT DISPOSITION:  PACU - hemodynamically stable.   Delay start of Pharmacological VTE agent (>24hrs) due to surgical blood loss or risk of bleeding: not applicable

## 2023-09-13 NOTE — Anesthesia Postprocedure Evaluation (Signed)
Anesthesia Post Note  Patient: Lindsay Underwood  Procedure(s) Performed: DILATATION & CURETTAGE/HYSTEROSCOPY WITH MYOSURE AND NOVASURE ABLATION (Uterus)     Patient location during evaluation: PACU Anesthesia Type: General Level of consciousness: awake and alert Pain management: pain level controlled Vital Signs Assessment: post-procedure vital signs reviewed and stable Respiratory status: spontaneous breathing, nonlabored ventilation and respiratory function stable Cardiovascular status: blood pressure returned to baseline Postop Assessment: no apparent nausea or vomiting Anesthetic complications: no   No notable events documented.  Last Vitals:  Vitals:   09/13/23 0930 09/13/23 0945  BP: 104/64 (!) 101/59  Pulse: 72 72  Resp: 14 14  Temp:    SpO2: 100% 100%    Last Pain:  Vitals:   09/13/23 0945  TempSrc:   PainSc: 0-No pain                 Shanda Howells

## 2023-09-13 NOTE — H&P (Signed)
OB/GYN Pre-Op History and Physical  Lindsay Underwood is a 42 y.o. [id number] presenting for sugical management of AUB.       Past Medical History:  Diagnosis Date   Anemia 2018   Anxiety    Cystocele, midline    Depression    Hemorrhoids    Hypothyroidism    Rectocele    Urinary, incontinence, stress female 09/05/2016    Past Surgical History:  Procedure Laterality Date   ANTERIOR AND POSTERIOR VAGINAL REPAIR     07/2017. done with h/s, D&C, BTL   BILATERAL SALPINGECTOMY     laparoscopic   BIOPSY VULVA  02/24/2023   ENDOMETRIAL BIOPSY  05/30/2023   HYSTEROSCOPY WITH D & C      OB History  Gravida Para Term Preterm AB Living  2 2 2   2   SAB IAB Ectopic Multiple Live Births      2    # Outcome Date GA Lbr Len/2nd Weight Sex Type Anes PTL Lv  2 Term 05/26/12 [redacted]w[redacted]d  2863 g M Vag-Spont   LIV  1 Term 07/19/09 [redacted]w[redacted]d  2750 g M Vag-Spont   LIV    Obstetric Comments  Menstrual age: 53    Age 1st Pregnancy: 37    Social History   Socioeconomic History   Marital status: Legally Separated    Spouse name: Not on file   Number of children: Not on file   Years of education: Not on file   Highest education level: Not on file  Occupational History   Not on file  Tobacco Use   Smoking status: Never   Smokeless tobacco: Never  Vaping Use   Vaping status: Never Used  Substance and Sexual Activity   Alcohol use: Yes    Comment: occasional   Drug use: No   Sexual activity: Yes    Birth control/protection: Surgical  Other Topics Concern   Not on file  Social History Narrative   Not on file   Social Drivers of Health   Financial Resource Strain: Low Risk  (07/18/2023)   Received from [facility]   Overall Financial Resource Strain (CARDIA)    Difficulty of Paying Living Expenses: Not hard at all  Food Insecurity: No Food Insecurity (07/18/2023)   Received from [facility]   Hunger Vital Sign    Worried About Running Out of Food in the  Last Year: Never true    Ran Out of Food in the Last Year: Never true  Transportation Needs: No Transportation Needs (07/18/2023)   Received from [facility] - Transportation    In the past 12 months, has lack of transportation kept you from medical appointments or from getting medications?: No    Lack of Transportation (Non-Medical): No  Physical Activity: Not on file  Stress: Not on file  Social Connections: Not on file    Family History  Problem Relation Age of Onset   Diabetes Father    Heart disease Father    Huntington's disease Mother    Huntington's disease Brother    Sleep apnea Sister     Medications Prior to Admission  Medication Sig Dispense Refill Last Dose/Taking   buPROPion (WELLBUTRIN XL) 150 MG 24 hr tablet Take by mouth.   09/13/2023 Morning   cetirizine (ZYRTEC) 10 MG tablet Take 10 mg by mouth daily.   09/12/2023   levothyroxine (SYNTHROID) 50 MCG tablet Take 50 mcg by mouth.   09/12/2023  montelukast (SINGULAIR) 10 MG tablet    09/12/2023   Multiple Vitamin (MULTI-VITAMINS) TABS Take by mouth.   09/12/2023   sertraline (ZOLOFT) 50 MG tablet Take 100 mg by mouth daily.   09/12/2023   fluticasone (FLONASE) 50 MCG/ACT nasal spray Place 1 spray into both nostrils daily as needed for allergies or rhinitis.   More than a month   promethazine-dextromethorphan (PROMETHAZINE-DM) 6.25-15 MG/5ML syrup Take 5 mLs by mouth 4 (four) times daily as needed. 118 mL 0 More than a month    No Known Allergies  Review of Systems: Negative except for what is mentioned in HPI.     Physical Exam: BP (!) 103/57   Pulse 70   Temp 97.8 F (36.6 C) (Oral)   Resp 17   Ht 5\' 4"  (1.626 m)   Wt 64.8 kg   LMP 08/28/2023 (Approximate)   SpO2 98%   BMI 24.51 kg/m  CONSTITUTIONAL: Well-developed, well-nourished female in no acute distress.  HENT:  Normocephalic, atraumatic, External right and left ear normal. Oropharynx is clear and moist EYES:  Conjunctivae and EOM are normal. Pupils are equal, round, and reactive to light. No scleral icterus.  NECK: Normal range of motion, supple, no masses SKIN: Skin is warm and dry. No rash noted. Not diaphoretic. No erythema. No pallor. NEUROLGIC: Alert and oriented to person, place, and time. Normal reflexes, muscle tone coordination. No cranial nerve deficit noted. PSYCHIATRIC: Normal mood and affect. Normal behavior. Normal judgment and thought content. RESPIRATORY: normal effort PELVIC: Deferred MUSCULOSKELETAL: Normal range of motion. No edema and no tenderness. 2+ distal pulses.   Pertinent Labs/Studies:   Results for orders placed or performed during the hospital encounter of 09/13/23 (from the past 72 hours)  Pregnancy, urine POC     Status: None   Collection Time: 09/13/23  6:46 AM  Result Value Ref Range   Preg Test, Ur NEGATIVE NEGATIVE    Comment:        THE SENSITIVITY OF THIS METHODOLOGY IS >24 mIU/mL        Assessment and Plan :Lindsay Underwood is a 42 y.o. [id number] here for AUB.   Plan for novasure ablation   Lorriane Shire, M.D. Minimally Invasive Gynecologic Surgery and Pelvic Pain Specialist Attending Obstetrician & Gynecologist, [facility] for [employer], [facility]

## 2023-09-13 NOTE — Discharge Instructions (Addendum)
Post-surgical Instructions, Outpatient Surgery  You may expect to feel dizzy, weak, and drowsy for as long as 24 hours after receiving the medicine that made you sleep (anesthetic). For the first 24 hours after your surgery:   Do not drive a car, ride a bicycle, participate in physical activities, or take public transportation  Do not drink alcohol or take tranquilizers.  Do not take medicine that has not been prescribed by your physicians.  Do not sign important papers or make important decisions while on narcotic pain medicines.  Have a responsible person with you.   PAIN MANAGEMENT Ibuprofen 800mg .  (This is the same as 4-200mg  over the counter tablets of Motrin or ibuprofen.)  Take this every 6 hours or as needed for cramping.   Acetaminophen 1000mg  (This is the same as 2-500mg  over the counter extra strength tylenol). Take this every 6 hours for the first 3 days or as needed afterwards for pain   DO'S AND DON'T'S Do not take a tub bath for 4 weeks.  You may shower on the first day after your surgery Do not do any heavy lifting for one to two weeks.  This increases the chance of bleeding. Do move around as you feel able.  Stairs are fine.  You may begin to exercise again as you feel able.  Do not lift any weights for two weeks. Do not put anything in the vagina for two weeks--no tampons, intercourse, or douching.    REGULAR MEDIATIONS/VITAMINS: You may restart all of your regular medications as prescribed. You may restart all of your vitamins as you normally take them.    PLEASE CALL OR SEEK MEDICAL CARE IF: You have persistent nausea and vomiting.  You have trouble eating or drinking.  You have an oral temperature above 100.5.  You have constipation that is not helped by adjusting diet or increasing fluid intake. Pain medicines are a common cause of constipation.  You have heavy vaginal bleeding You have redness or drainage from your incision(s) or there is increasing pain or  tenderness near or in the surgical site.      Post Anesthesia Home Care Instructions  Activity: Get plenty of rest for the remainder of the day. A responsible individual must stay with you for 24 hours following the procedure.  For the next 24 hours, DO NOT: -Drive a car -Advertising copywriter -Drink alcoholic beverages -Take any medication unless instructed by your physician -Make any legal decisions or sign important papers.  Meals: Start with liquid foods such as gelatin or soup. Progress to regular foods as tolerated. Avoid greasy, spicy, heavy foods. If nausea and/or vomiting occur, drink only clear liquids until the nausea and/or vomiting subsides. Call your physician if vomiting continues.  Special Instructions/Symptoms: Your throat may feel dry or sore from the anesthesia or the breathing tube placed in your throat during surgery. If this causes discomfort, gargle with warm salt water. The discomfort should disappear within 24 hours.  If you had a scopolamine patch placed behind your ear for the management of post- operative nausea and/or vomiting:  1. The medication in the patch is effective for 72 hours, after which it should be removed.  Wrap patch in a tissue and discard in the trash. Wash hands thoroughly with soap and water. 2. You may remove the patch earlier than 72 hours if you experience unpleasant side effects which may include dry mouth, dizziness or visual disturbances. 3. Avoid touching the patch. Wash your hands with soap and  water after contact with the patch.   No acetaminophen/Tylenol until after 1:10 pm today if needed. No ibuprofen, Advil, Aleve, Motrin, ketorolac, meloxicam, naproxen, or other NSAIDS until after 3 pm today if needed.

## 2023-09-13 NOTE — Anesthesia Procedure Notes (Signed)
Procedure Name: LMA Insertion Date/Time: 09/13/2023 8:34 AM  Performed by: Bishop Limbo, CRNAPre-anesthesia Checklist: Patient identified, Emergency Drugs available, Suction available and Patient being monitored Patient Re-evaluated:Patient Re-evaluated prior to induction Oxygen Delivery Method: Circle System Utilized Preoxygenation: Pre-oxygenation with 100% oxygen Induction Type: IV induction Ventilation: Mask ventilation without difficulty LMA: LMA inserted LMA Size: 4.0 Number of attempts: 1 Placement Confirmation: positive ETCO2 Tube secured with: Tape Dental Injury: Teeth and Oropharynx as per pre-operative assessment

## 2023-09-14 ENCOUNTER — Encounter (HOSPITAL_BASED_OUTPATIENT_CLINIC_OR_DEPARTMENT_OTHER): Payer: Self-pay | Admitting: Obstetrics and Gynecology

## 2023-09-14 LAB — SURGICAL PATHOLOGY

## 2023-09-15 ENCOUNTER — Telehealth: Payer: Self-pay | Admitting: Lactation Services

## 2023-09-15 NOTE — Telephone Encounter (Signed)
-----   Message from Lorriane Shire sent at 09/14/2023 12:33 PM EST ----- Benign path confirms polyp

## 2023-09-15 NOTE — Telephone Encounter (Signed)
Called and spoke with patient. Informed her biopsy was Polyp with no abnormal cells. Patient voiced understanding with no questions or concerns at this time.

## 2023-10-09 ENCOUNTER — Ambulatory Visit: Payer: Managed Care, Other (non HMO) | Admitting: Obstetrics and Gynecology

## 2024-05-28 ENCOUNTER — Other Ambulatory Visit: Payer: Self-pay | Admitting: Obstetrics and Gynecology

## 2024-05-28 DIAGNOSIS — Z1231 Encounter for screening mammogram for malignant neoplasm of breast: Secondary | ICD-10-CM

## 2024-05-31 ENCOUNTER — Encounter

## 2024-05-31 DIAGNOSIS — Z1231 Encounter for screening mammogram for malignant neoplasm of breast: Secondary | ICD-10-CM

## 2024-06-03 ENCOUNTER — Ambulatory Visit: Admitting: Obstetrics and Gynecology

## 2024-06-06 ENCOUNTER — Other Ambulatory Visit: Payer: Self-pay | Admitting: Obstetrics and Gynecology

## 2024-06-06 DIAGNOSIS — Z1231 Encounter for screening mammogram for malignant neoplasm of breast: Secondary | ICD-10-CM

## 2024-06-18 ENCOUNTER — Ambulatory Visit
Admission: RE | Admit: 2024-06-18 | Discharge: 2024-06-18 | Disposition: A | Discharge status: Home / Self Care | Source: Ambulatory Visit

## 2024-06-18 ENCOUNTER — Ambulatory Visit (INDEPENDENT_AMBULATORY_CARE_PROVIDER_SITE_OTHER): Admitting: Obstetrics and Gynecology

## 2024-06-18 ENCOUNTER — Encounter

## 2024-06-18 ENCOUNTER — Other Ambulatory Visit (HOSPITAL_COMMUNITY)
Admission: RE | Admit: 2024-06-18 | Discharge: 2024-06-18 | Disposition: A | Discharge status: Home / Self Care | Source: Ambulatory Visit | Attending: Obstetrics and Gynecology | Admitting: Obstetrics and Gynecology

## 2024-06-18 VITALS — BP 104/70 | HR 54 | Ht 64.0 in | Wt 146.6 lb

## 2024-06-18 DIAGNOSIS — Z1151 Encounter for screening for human papillomavirus (HPV): Secondary | ICD-10-CM | POA: Insufficient documentation

## 2024-06-18 DIAGNOSIS — N898 Other specified noninflammatory disorders of vagina: Secondary | ICD-10-CM

## 2024-06-18 DIAGNOSIS — Z124 Encounter for screening for malignant neoplasm of cervix: Secondary | ICD-10-CM

## 2024-06-18 DIAGNOSIS — Z23 Encounter for immunization: Secondary | ICD-10-CM

## 2024-06-18 DIAGNOSIS — Z01419 Encounter for gynecological examination (general) (routine) without abnormal findings: Secondary | ICD-10-CM | POA: Diagnosis not present

## 2024-06-18 DIAGNOSIS — N941 Unspecified dyspareunia: Secondary | ICD-10-CM | POA: Insufficient documentation

## 2024-06-18 DIAGNOSIS — Z113 Encounter for screening for infections with a predominantly sexual mode of transmission: Secondary | ICD-10-CM | POA: Insufficient documentation

## 2024-06-18 DIAGNOSIS — Z1231 Encounter for screening mammogram for malignant neoplasm of breast: Secondary | ICD-10-CM

## 2024-06-18 DIAGNOSIS — R102 Pelvic and perineal pain: Secondary | ICD-10-CM

## 2024-06-18 NOTE — Progress Notes (Unsigned)
 Patient presents for Annual.  LMP: No LMP recorded. (Menstrual status: Irregular Periods).  Last pap: 05/17/2022  Contraception: None Mammogram: 06/18/2024 STD Screening: Declines Flu Vaccine : Accepts  CC: annual and flu  Fun Fact: woman sterilization procedure

## 2024-06-19 ENCOUNTER — Encounter: Payer: Self-pay | Admitting: Obstetrics and Gynecology

## 2024-06-19 LAB — CERVICOVAGINAL ANCILLARY ONLY
Bacterial Vaginitis (gardnerella): NEGATIVE
Candida Glabrata: NEGATIVE
Candida Vaginitis: NEGATIVE
Chlamydia: NEGATIVE
Comment: NEGATIVE
Comment: NEGATIVE
Comment: NEGATIVE
Comment: NEGATIVE
Comment: NEGATIVE
Comment: NORMAL
Neisseria Gonorrhea: NEGATIVE
Trichomonas: NEGATIVE

## 2024-06-19 NOTE — Progress Notes (Signed)
 Obstetrics and Gynecology Annual Patient Evaluation  Appointment Date: 06/18/2024  OBGYN Clinic: [facility]  Primary Care Provider: Diedra Lame  Chief Complaint:  Chief Complaint  Patient presents with   Gynecologic Exam    Annual with pap     History of Present Illness: Lindsay Underwood is a 43 y.o. Caucasian [id number] (No LMP recorded. (Menstrual status: Other).), seen for the above chief complaint. Her past medical history is significant for h/o 08/2023 novasure with Dr. Jeralyn with polyps on pathology, h/o prior chronic mid cycle AUB, h/o BTL with hysteroscopy/d&c/A-P repair in 2019, hypothyroidism  Patient denies any period bleeding and minimal cramping since the novasure.   She notes difficulties with losing weight despite increase in her exercise and increase SUI s/s with exercise.   Occasional mid cycle dyspaurenia and pain  Review of Systems: Pertinent items noted in HPI and remainder of comprehensive ROS otherwise negative.   Past Medical History:  Past Medical History:  Diagnosis Date   Anemia 2018   Anxiety    Cystocele, midline    Depression    Hemorrhoids    Hypothyroidism    Rectocele    Urinary, incontinence, stress female 09/05/2016    Past Surgical History:  Past Surgical History:  Procedure Laterality Date   ANTERIOR AND POSTERIOR VAGINAL REPAIR     07/2017. done with h/s, D&C, BTL   BILATERAL SALPINGECTOMY     laparoscopic   BIOPSY VULVA  02/24/2023   DILITATION & CURRETTAGE/HYSTROSCOPY WITH NOVASURE ABLATION N/A 09/13/2023   Procedure: DILATATION & CURETTAGE/HYSTEROSCOPY WITH MYOSURE AND NOVASURE ABLATION;  Surgeon: Jeralyn Crutch, MD;  Location: [facility];  Service: Gynecology;  Laterality: N/A;   ENDOMETRIAL BIOPSY  05/30/2023   HYSTEROSCOPY WITH D & C      Past Obstetrical History:  OB History  Gravida Para Term Preterm AB Living  2 2 2   2   SAB IAB Ectopic Multiple Live Births      2     # Outcome Date GA Lbr Len/2nd Weight Sex Type Anes PTL Lv  2 Term 05/26/12 [redacted]w[redacted]d  6 lb 5 oz (2.863 kg) M Vag-Spont   LIV  1 Term 07/19/09 [redacted]w[redacted]d  6 lb 1 oz (2.75 kg) M Vag-Spont   LIV    Obstetric Comments  Menstrual age: 27    Age 1st Pregnancy: 86   Past Gynecological History: As per HPI. History of Pap Smear(s): Yes.   Last pap 2023, which was negative cytology and hpv She is currently using bilateral tubal ligation for contraception.   Social History:  Social History   Socioeconomic History   Marital status: Legally Separated    Spouse name: Not on file   Number of children: Not on file   Years of education: Not on file   Highest education level: Not on file  Occupational History   Not on file  Tobacco Use   Smoking status: Never   Smokeless tobacco: Never  Vaping Use   Vaping status: Never Used  Substance and Sexual Activity   Alcohol use: Yes    Comment: occasional   Drug use: No   Sexual activity: Yes    Birth control/protection: Surgical  Other Topics Concern   Not on file  Social History Narrative   Not on file   Social Drivers of Health   Financial Resource Strain: Low Risk  (07/18/2023)   Received from [facility]   Overall Financial Resource Strain (CARDIA)  Difficulty of Paying Living Expenses: Not hard at all  Food Insecurity: No Food Insecurity (07/18/2023)   Received from [facility]   Hunger Vital Sign    Within the past 12 months, you worried that your food would run out before you got the money to buy more.: Never true    Within the past 12 months, the food you bought just didn't last and you didn't have money to get more.: Never true  Transportation Needs: No Transportation Needs (07/18/2023)   Received from [facility] - Transportation    In the past 12 months, has lack of transportation kept you from medical appointments or from getting medications?: No    Lack of  Transportation (Non-Medical): No  Physical Activity: Not on file  Stress: Not on file  Social Connections: Not on file  Intimate Partner Violence: Not on file    Family History:  Family History  Problem Relation Age of Onset   Diabetes Father    Heart disease Father    Huntington's disease Mother    Huntington's disease Brother    Sleep apnea Sister     Medications Birgit Nowling had no medications administered during this visit. Current Outpatient Medications  Medication Sig Dispense Refill   acetaminophen  (TYLENOL ) 500 MG tablet Take 2 tablets (1,000 mg total) by mouth every 6 (six) hours as needed. 60 tablet 0   buPROPion (WELLBUTRIN XL) 150 MG 24 hr tablet Take by mouth.     cetirizine (ZYRTEC) 10 MG tablet Take 10 mg by mouth daily.     fluticasone (FLONASE) 50 MCG/ACT nasal spray Place 1 spray into both nostrils daily as needed for allergies or rhinitis.     ibuprofen  (ADVIL ) 800 MG tablet Take 1 tablet (800 mg total) by mouth 3 (three) times daily with meals as needed for headache, moderate pain (pain score 4-6) or cramping. 30 tablet 1   levothyroxine (SYNTHROID) 50 MCG tablet Take 50 mcg by mouth.     meloxicam (MOBIC) 7.5 MG tablet Take 7.5 mg by mouth.     montelukast (SINGULAIR) 10 MG tablet      Multiple Vitamin (MULTI-VITAMINS) TABS Take by mouth.     sertraline (ZOLOFT) 50 MG tablet Take 100 mg by mouth daily.     promethazine -dextromethorphan (PROMETHAZINE -DM) 6.25-15 MG/5ML syrup Take 5 mLs by mouth 4 (four) times daily as needed. 118 mL 0   No current facility-administered medications for this visit.    Allergies Patient has no known allergies.   Physical Exam:  BP 104/70 (BP Location: Left Arm, Patient Position: Sitting, Cuff Size: Normal)   Pulse (!) 54   Ht 5' 4 (1.626 m)   Wt 146 lb 9.6 oz (66.5 kg)   BMI 25.16 kg/m  Body mass index is 25.16 kg/m. General appearance: Well nourished, well developed female in no acute distress.  Neck:  Supple, normal  appearance, and no thyromegaly  Respiratory:  Normal respiratory effort Abdomen: positive bowel sounds and no masses, hernias; diffusely non tender to palpation, non distended Neuro/Psych:  Normal mood and affect.  Skin:  Warm and dry.  Lymphatic:  No inguinal lymphadenopathy.   Cervical exam performed in the presence of a chaperone Pelvic exam: is not limited by body habitus EGBUS: within normal limits Vagina: moderate amount of white, clumpy discharge in vault, no blood. On split speculum exam, anterior and posterior vault prolapse seems a little worse than last year with cystocele to -1 to -2  and rectocele to -2 to -1 Cervix: normal appearing cervix without tenderness, discharge or lesions. Uterus:  nonenlarged and non tender, mobile Adnexa:  normal adnexa and no mass, fullness, tenderness Rectovaginal: deferred 1.5cm external hemorrhoid, wnl, noted  Laboratory: none  Radiology: none  Assessment: patient stable  Plan:  1. Well woman exam Mammogram done earlier today   2. Prolapse Stable. Pelvic floor PT recommended as increased weight training and exercise can cause increased SUI s/s. Pt to consider.   3. Dyspareunia H/o mittelschmerz. May be due to this. Expectant management recommended.  Follow up pap and swabs  Bebe Izell Raddle MD Attending [facility])

## 2024-06-25 LAB — CYTOLOGY - PAP
Comment: NEGATIVE
Diagnosis: NEGATIVE
Diagnosis: REACTIVE
High risk HPV: NEGATIVE

## 2024-06-28 ENCOUNTER — Ambulatory Visit: Payer: Self-pay | Admitting: Obstetrics and Gynecology

## 2024-08-19 ENCOUNTER — Telehealth: Payer: Self-pay | Admitting: Obstetrics and Gynecology

## 2024-08-19 NOTE — Telephone Encounter (Signed)
 [place] with [facility] is calling to see if the labs from 06-22-24 can be re-coded due to her having her Annual done at that time.  He is stating that the way the labs are coded now it is coming from her deductible. They are wanting to prevent that if they can help it.
